# Patient Record
Sex: Female | Born: 1935
Health system: Southern US, Community
[De-identification: ages and names within clinical notes are randomized; demographics above are authoritative.]

## PROBLEM LIST (undated history)

## (undated) DIAGNOSIS — E1140 Type 2 diabetes mellitus with diabetic neuropathy, unspecified: Secondary | ICD-10-CM

## (undated) DIAGNOSIS — K5790 Diverticulosis of intestine, part unspecified, without perforation or abscess without bleeding: Secondary | ICD-10-CM

## (undated) DIAGNOSIS — E785 Hyperlipidemia, unspecified: Secondary | ICD-10-CM

## (undated) DIAGNOSIS — K56609 Unspecified intestinal obstruction, unspecified as to partial versus complete obstruction: Secondary | ICD-10-CM

## (undated) DIAGNOSIS — I10 Essential (primary) hypertension: Secondary | ICD-10-CM

## (undated) HISTORY — DX: Unspecified intestinal obstruction, unspecified as to partial versus complete obstruction: K56.609

## (undated) HISTORY — DX: Type 2 diabetes mellitus with diabetic neuropathy, unspecified: E11.40

## (undated) HISTORY — DX: Essential (primary) hypertension: I10

## (undated) HISTORY — DX: Diverticulosis of intestine, part unspecified, without perforation or abscess without bleeding: K57.90

## (undated) HISTORY — DX: Hyperlipidemia, unspecified: E78.5

## (undated) HISTORY — PX: ABDOMINAL HYSTERECTOMY: SHX81

---

## 1998-04-03 ENCOUNTER — Other Ambulatory Visit: Admission: RE | Admit: 1998-04-03 | Discharge: 1998-04-03 | Payer: Self-pay | Admitting: Obstetrics & Gynecology

## 1999-03-26 ENCOUNTER — Encounter: Admission: RE | Admit: 1999-03-26 | Discharge: 1999-03-26 | Payer: Self-pay | Admitting: Obstetrics & Gynecology

## 1999-03-26 ENCOUNTER — Encounter: Payer: Self-pay | Admitting: Obstetrics & Gynecology

## 2000-04-13 ENCOUNTER — Encounter: Admission: RE | Admit: 2000-04-13 | Discharge: 2000-04-13 | Payer: Self-pay | Admitting: Obstetrics & Gynecology

## 2000-04-13 ENCOUNTER — Encounter: Payer: Self-pay | Admitting: Obstetrics & Gynecology

## 2001-03-12 ENCOUNTER — Emergency Department (HOSPITAL_COMMUNITY): Admission: EM | Admit: 2001-03-12 | Discharge: 2001-03-12 | Payer: Self-pay | Admitting: *Deleted

## 2001-03-29 ENCOUNTER — Encounter: Payer: Self-pay | Admitting: Emergency Medicine

## 2001-03-29 ENCOUNTER — Emergency Department (HOSPITAL_COMMUNITY): Admission: EM | Admit: 2001-03-29 | Discharge: 2001-03-30 | Payer: Self-pay | Admitting: Emergency Medicine

## 2001-06-11 ENCOUNTER — Encounter: Payer: Self-pay | Admitting: Obstetrics & Gynecology

## 2001-06-11 ENCOUNTER — Encounter: Admission: RE | Admit: 2001-06-11 | Discharge: 2001-06-11 | Payer: Self-pay | Admitting: Obstetrics & Gynecology

## 2002-10-21 ENCOUNTER — Ambulatory Visit (HOSPITAL_COMMUNITY): Admission: RE | Admit: 2002-10-21 | Discharge: 2002-10-21 | Payer: Self-pay | Admitting: Gastroenterology

## 2003-12-25 ENCOUNTER — Other Ambulatory Visit: Admission: RE | Admit: 2003-12-25 | Discharge: 2003-12-25 | Payer: Self-pay | Admitting: Obstetrics & Gynecology

## 2005-12-03 ENCOUNTER — Ambulatory Visit: Payer: Self-pay | Admitting: Family Medicine

## 2006-09-07 ENCOUNTER — Ambulatory Visit: Payer: Self-pay | Admitting: Family Medicine

## 2006-12-17 ENCOUNTER — Ambulatory Visit: Payer: Self-pay | Admitting: Family Medicine

## 2006-12-30 ENCOUNTER — Ambulatory Visit: Payer: Self-pay | Admitting: Family Medicine

## 2007-02-04 HISTORY — PX: COLONOSCOPY: SHX174

## 2007-04-30 ENCOUNTER — Ambulatory Visit: Payer: Self-pay | Admitting: Family Medicine

## 2007-11-09 ENCOUNTER — Ambulatory Visit: Payer: Self-pay | Admitting: Family Medicine

## 2007-11-12 LAB — HM COLONOSCOPY

## 2007-11-23 ENCOUNTER — Ambulatory Visit: Payer: Self-pay | Admitting: Family Medicine

## 2008-05-31 ENCOUNTER — Ambulatory Visit: Payer: Self-pay | Admitting: Family Medicine

## 2008-09-29 ENCOUNTER — Ambulatory Visit: Payer: Self-pay | Admitting: Family Medicine

## 2008-11-24 ENCOUNTER — Ambulatory Visit: Payer: Self-pay | Admitting: Family Medicine

## 2008-11-25 ENCOUNTER — Inpatient Hospital Stay (HOSPITAL_COMMUNITY): Admission: EM | Admit: 2008-11-25 | Discharge: 2008-11-30 | Payer: Self-pay | Admitting: Emergency Medicine

## 2008-11-28 ENCOUNTER — Encounter (INDEPENDENT_AMBULATORY_CARE_PROVIDER_SITE_OTHER): Payer: Self-pay | Admitting: Internal Medicine

## 2008-12-14 ENCOUNTER — Ambulatory Visit: Payer: Self-pay | Admitting: Family Medicine

## 2009-05-18 ENCOUNTER — Ambulatory Visit: Payer: Self-pay | Admitting: Family Medicine

## 2009-10-12 ENCOUNTER — Ambulatory Visit: Payer: Self-pay | Admitting: Family Medicine

## 2009-11-14 ENCOUNTER — Ambulatory Visit: Payer: Self-pay | Admitting: Family Medicine

## 2010-02-15 ENCOUNTER — Ambulatory Visit
Admission: RE | Admit: 2010-02-15 | Discharge: 2010-02-15 | Payer: Self-pay | Source: Home / Self Care | Attending: Family Medicine | Admitting: Family Medicine

## 2010-05-09 LAB — BASIC METABOLIC PANEL
BUN: 12 mg/dL (ref 6–23)
BUN: 31 mg/dL — ABNORMAL HIGH (ref 6–23)
BUN: 6 mg/dL (ref 6–23)
CO2: 20 mEq/L (ref 19–32)
CO2: 21 mEq/L (ref 19–32)
CO2: 22 mEq/L (ref 19–32)
Calcium: 7.8 mg/dL — ABNORMAL LOW (ref 8.4–10.5)
Chloride: 106 mEq/L (ref 96–112)
Chloride: 108 mEq/L (ref 96–112)
Chloride: 108 mEq/L (ref 96–112)
Chloride: 112 mEq/L (ref 96–112)
Creatinine, Ser: 0.83 mg/dL (ref 0.4–1.2)
Creatinine, Ser: 0.89 mg/dL (ref 0.4–1.2)
GFR calc Af Amer: >60 mL/min (ref >60)
GFR calc Af Amer: >60 mL/min (ref >60)
GFR calc non Af Amer: 53 mL/min — ABNORMAL LOW (ref >60)
Glucose, Bld: 125 mg/dL — ABNORMAL HIGH (ref 70–99)
Glucose, Bld: 182 mg/dL — ABNORMAL HIGH (ref 70–99)
Glucose, Bld: 87 mg/dL (ref 70–99)
Glucose, Bld: 90 mg/dL (ref 70–99)
Potassium: 3.5 mEq/L (ref 3.5–5.1)
Potassium: 3.6 mEq/L (ref 3.5–5.1)
Sodium: 134 mEq/L — ABNORMAL LOW (ref 135–145)
Sodium: 141 mEq/L (ref 135–145)

## 2010-05-09 LAB — MAGNESIUM
Magnesium: 1.9 mg/dL (ref 1.5–2.5)
Magnesium: 2 mg/dL (ref 1.5–2.5)
Magnesium: 2.1 mg/dL (ref 1.5–2.5)
Magnesium: 2.1 mg/dL (ref 1.5–2.5)

## 2010-05-09 LAB — CBC
HCT: 32 % — ABNORMAL LOW (ref 36.0–46.0)
HCT: 34 % — ABNORMAL LOW (ref 36.0–46.0)
HCT: 46.6 % — ABNORMAL HIGH (ref 36.0–46.0)
Hemoglobin: 11.5 g/dL — ABNORMAL LOW (ref 12.0–15.0)
Hemoglobin: 11.8 g/dL — ABNORMAL LOW (ref 12.0–15.0)
Hemoglobin: 15.6 g/dL — ABNORMAL HIGH (ref 12.0–15.0)
MCHC: 33.5 g/dL (ref 30.0–36.0)
MCHC: 33.8 g/dL (ref 30.0–36.0)
MCHC: 34.1 g/dL (ref 30.0–36.0)
MCHC: 34.3 g/dL (ref 30.0–36.0)
MCV: 87.6 fL (ref 78.0–100.0)
MCV: 87.8 fL (ref 78.0–100.0)
MCV: 87.9 fL (ref 78.0–100.0)
MCV: 89 fL (ref 78.0–100.0)
Platelets: 120 10*3/uL — ABNORMAL LOW (ref 150–400)
Platelets: 127 10*3/uL — ABNORMAL LOW (ref 150–400)
Platelets: 135 10*3/uL — ABNORMAL LOW (ref 150–400)
RBC: 3.76 MIL/uL — ABNORMAL LOW (ref 3.87–5.11)
RBC: 3.94 MIL/uL (ref 3.87–5.11)
RBC: 5.26 MIL/uL — ABNORMAL HIGH (ref 3.87–5.11)
RDW: 13.9 % (ref 11.5–15.5)
RDW: 14 % (ref 11.5–15.5)
RDW: 14.1 % (ref 11.5–15.5)
RDW: 14.1 % (ref 11.5–15.5)
WBC: 5.2 10*3/uL (ref 4.0–10.5)
WBC: 5.7 10*3/uL (ref 4.0–10.5)
WBC: 5.9 10*3/uL (ref 4.0–10.5)

## 2010-05-09 LAB — COMPREHENSIVE METABOLIC PANEL
ALT: 22 U/L (ref 0–35)
AST: 28 U/L (ref 0–37)
Albumin: 2.5 g/dL — ABNORMAL LOW (ref 3.5–5.2)
Alkaline Phosphatase: 68 U/L (ref 39–117)
BUN: 50 mg/dL — ABNORMAL HIGH (ref 6–23)
BUN: 58 mg/dL — ABNORMAL HIGH (ref 6–23)
Calcium: 10.4 mg/dL (ref 8.4–10.5)
GFR calc non Af Amer: 36 mL/min — ABNORMAL LOW (ref >60)
Glucose, Bld: 133 mg/dL — ABNORMAL HIGH (ref 70–99)
Potassium: 3.2 mEq/L — ABNORMAL LOW (ref 3.5–5.1)
Sodium: 141 mEq/L (ref 135–145)
Total Bilirubin: 0.7 mg/dL (ref 0.3–1.2)

## 2010-05-09 LAB — GLUCOSE, CAPILLARY
Glucose-Capillary: 102 mg/dL — ABNORMAL HIGH (ref 70–99)
Glucose-Capillary: 105 mg/dL — ABNORMAL HIGH (ref 70–99)
Glucose-Capillary: 110 mg/dL — ABNORMAL HIGH (ref 70–99)
Glucose-Capillary: 128 mg/dL — ABNORMAL HIGH (ref 70–99)
Glucose-Capillary: 132 mg/dL — ABNORMAL HIGH (ref 70–99)
Glucose-Capillary: 137 mg/dL — ABNORMAL HIGH (ref 70–99)
Glucose-Capillary: 143 mg/dL — ABNORMAL HIGH (ref 70–99)
Glucose-Capillary: 168 mg/dL — ABNORMAL HIGH (ref 70–99)
Glucose-Capillary: 74 mg/dL (ref 70–99)
Glucose-Capillary: 81 mg/dL (ref 70–99)
Glucose-Capillary: 82 mg/dL (ref 70–99)
Glucose-Capillary: 82 mg/dL (ref 70–99)
Glucose-Capillary: 99 mg/dL (ref 70–99)

## 2010-05-09 LAB — URINALYSIS, ROUTINE W REFLEX MICROSCOPIC
Bilirubin Urine: NEGATIVE
Ketones, ur: 15 mg/dL — AB
Nitrite: NEGATIVE
Specific Gravity, Urine: 1.024 (ref 1.005–1.030)
Urobilinogen, UA: 0.2 mg/dL (ref 0.0–1.0)
pH: 5 (ref 5.0–8.0)

## 2010-05-09 LAB — PHOSPHORUS: Phosphorus: 1.1 mg/dL — ABNORMAL LOW (ref 2.3–4.6)

## 2010-05-09 LAB — LACTIC ACID, PLASMA
Lactic Acid, Venous: 0.7 mmol/L (ref 0.5–2.2)
Lactic Acid, Venous: 1 mmol/L (ref 0.5–2.2)

## 2010-05-09 LAB — DIFFERENTIAL
Basophils Relative: 0 % (ref 0–1)
Eosinophils Absolute: 0 10*3/uL (ref 0.0–0.7)
Eosinophils Absolute: 0.1 10*3/uL (ref 0.0–0.7)
Eosinophils Relative: 0 % (ref 0–5)
Eosinophils Relative: 2 % (ref 0–5)
Lymphocytes Relative: 12 % (ref 12–46)
Lymphocytes Relative: 12 % (ref 12–46)
Lymphs Abs: 0.7 10*3/uL (ref 0.7–4.0)
Monocytes Absolute: 0.4 10*3/uL (ref 0.1–1.0)

## 2010-05-09 LAB — POCT CARDIAC MARKERS: Troponin i, poc: <0.05 ng/mL (ref 0.00–0.09)

## 2010-05-09 LAB — HEMOGLOBIN A1C: Hgb A1c MFr Bld: 7.2 % — ABNORMAL HIGH (ref 4.6–6.1)

## 2010-05-09 LAB — URINE MICROSCOPIC-ADD ON

## 2010-05-09 LAB — LIPASE, BLOOD: Lipase: 23 U/L (ref 11–59)

## 2010-05-09 LAB — CALCIUM, IONIZED: Calcium, Ion: 1.15 mmol/L (ref 1.12–1.32)

## 2010-05-13 ENCOUNTER — Encounter: Payer: Self-pay | Admitting: Family Medicine

## 2010-06-14 ENCOUNTER — Encounter: Payer: Self-pay | Admitting: Family Medicine

## 2010-06-14 ENCOUNTER — Ambulatory Visit (INDEPENDENT_AMBULATORY_CARE_PROVIDER_SITE_OTHER): Payer: Medicare Other | Admitting: Family Medicine

## 2010-06-14 VITALS — BP 130/86 | HR 82 | Ht 64.8 in | Wt 171.0 lb

## 2010-06-14 DIAGNOSIS — J301 Allergic rhinitis due to pollen: Secondary | ICD-10-CM

## 2010-06-14 DIAGNOSIS — E1169 Type 2 diabetes mellitus with other specified complication: Secondary | ICD-10-CM

## 2010-06-14 DIAGNOSIS — E1159 Type 2 diabetes mellitus with other circulatory complications: Secondary | ICD-10-CM

## 2010-06-14 DIAGNOSIS — I152 Hypertension secondary to endocrine disorders: Secondary | ICD-10-CM

## 2010-06-14 DIAGNOSIS — E119 Type 2 diabetes mellitus without complications: Secondary | ICD-10-CM

## 2010-06-14 DIAGNOSIS — E785 Hyperlipidemia, unspecified: Secondary | ICD-10-CM

## 2010-06-14 DIAGNOSIS — Z0000 Encounter for general adult medical examination without abnormal findings: Secondary | ICD-10-CM

## 2010-06-14 DIAGNOSIS — I10 Essential (primary) hypertension: Secondary | ICD-10-CM

## 2010-06-14 DIAGNOSIS — J45909 Unspecified asthma, uncomplicated: Secondary | ICD-10-CM

## 2010-06-14 LAB — POCT URINALYSIS DIPSTICK
Ketones, UA: NEGATIVE
Protein, UA: NEGATIVE
Spec Grav, UA: 1.015

## 2010-06-14 LAB — CBC WITH DIFFERENTIAL/PLATELET
Basophils Absolute: 0 10*3/uL (ref 0.0–0.1)
Eosinophils Relative: 3 % (ref 0–5)
HCT: 37.8 % (ref 36.0–46.0)
Lymphocytes Relative: 28 % (ref 12–46)
Lymphs Abs: 1.6 10*3/uL (ref 0.7–4.0)
MCV: 86.5 fL (ref 78.0–100.0)
Monocytes Absolute: 0.4 10*3/uL (ref 0.1–1.0)
RDW: 14.4 % (ref 11.5–15.5)
WBC: 5.8 10*3/uL (ref 4.0–10.5)

## 2010-06-14 LAB — LIPID PANEL
Cholesterol: 119 mg/dL (ref 0–200)
HDL: 52 mg/dL (ref >39)
Total CHOL/HDL Ratio: 2.3 Ratio

## 2010-06-14 LAB — COMPREHENSIVE METABOLIC PANEL
BUN: 21 mg/dL (ref 6–23)
CO2: 23 mEq/L (ref 19–32)
Calcium: 9.2 mg/dL (ref 8.4–10.5)
Chloride: 105 mEq/L (ref 96–112)
Creat: 0.97 mg/dL (ref 0.40–1.20)
Glucose, Bld: 133 mg/dL — ABNORMAL HIGH (ref 70–99)

## 2010-06-14 NOTE — Progress Notes (Signed)
  Subjective:    Patient ID: Linda Garcia, female    DOB: [birth year], 76 y.o.   MRN: [id number]  HPI she is here for a complete examination. She continues on medications and they were reviewed. Advair still working for her although she is concerned that the medicine might not be functioning as well. She also is complaining of some earache.   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 Exercise is minimal especially during the allergy season Review of Systems  Constitutional: Negative.   HENT: Positive for ear pain, rhinorrhea, sneezing and postnasal drip.   Eyes: Negative.   Respiratory: Positive for wheezing.   Cardiovascular: Negative.   Gastrointestinal: Negative.   Genitourinary: Negative.   Musculoskeletal: Negative.   Skin: Negative.        Objective:   Physical Exam  Constitutional: She is oriented to person, place, and time. She appears well-developed and well-nourished.  HENT:  Head: Normocephalic and atraumatic.  Right Ear: External ear normal.  Left Ear: External ear normal.  Nose: Nose normal.  Mouth/Throat: Oropharynx is clear and moist.  Eyes: Conjunctivae and EOM are normal. Pupils are equal, round, and reactive to light. No scleral icterus.  Neck: Normal range of motion. Neck supple. No thyromegaly present.  Cardiovascular: Normal rate, regular rhythm, normal heart sounds and intact distal pulses.  Exam reveals no gallop and no friction rub.   No murmur heard. Abdominal: Soft. Bowel sounds are normal. She exhibits no distension and no mass. There is no tenderness.  Musculoskeletal: Normal range of motion.  Lymphadenopathy:    She has no cervical adenopathy.  Neurological: She is alert and oriented to person, place, and time. She displays abnormal reflex.          Assessment & Plan:  See chronic problem list. Patient will have a mammogram done and will also have her eye exam in the near future.

## 2010-06-14 NOTE — Patient Instructions (Signed)
Continue take good care of yourself. On her present medicines. I will call him the medicines for you. Check here in 4 months.

## 2010-06-17 ENCOUNTER — Telehealth: Payer: Self-pay

## 2010-06-17 NOTE — Telephone Encounter (Signed)
Left message that labs look really good continue on present meds and recheck in 4 months Linda Garcia

## 2010-06-21 NOTE — Op Note (Signed)
   NAME:  Linda Garcia, Linda Garcia                    ACCOUNT NO.:  [id number]   MEDICAL RECORD NO.:  [id number]                   PATIENT TYPE:  AMB   LOCATION:  ENDO                                 FACILITY:  [facility]   PHYSICIAN:  Graylin Shiver, M.D.                DATE OF BIRTH:  May 22, 1934   DATE OF PROCEDURE:  10/21/2002  DATE OF DISCHARGE:  10/21/2002                                 OPERATIVE REPORT   PROCEDURE:  Colonoscopy.   ENDOSCOPIST:  Graylin Shiver, M.D.   INDICATIONS:  Screening.   INFORMED CONSENT:  Informed consent was obtained after explanation of the  risks of bleeding, infection and perforation.   PREMEDICATION:  Fentanyl 62.5 mcg IV, Versed 7 mg IV.   DESCRIPTION OF PROCEDURE:  With the patient in the left lateral decubitus  position, a rectal exam was performed and no masses were felt.  The Olympus  colonoscope was inserted into the rectum and advanced around the colon to  the cecum.  The cecal landmarks were identified.  The cecum and ascending  colon were normal.  The transverse colon was normal.  The descending colon  and sigmoid revealed diverticulosis, which was most extensive in the sigmoid  region.  The rectum was normal.  She tolerated the procedure well without  complications.   IMPRESSION:  Left-sided diverticulosis.                                               Graylin Shiver, M.D.    Germain Osgood  D:  10/21/2002  T:  10/24/2002  Job:  [id number]   cc:   Sharlot Gowda, M.D.  [place]. [address] [place], [place] [address]  Fax: [phone]

## 2010-06-28 ENCOUNTER — Telehealth: Payer: Self-pay | Admitting: Family Medicine

## 2010-07-02 MED ORDER — EZETIMIBE-SIMVASTATIN 10-20 MG PO TABS: 1.0000 | ORAL_TABLET | Freq: Every day | ORAL | Status: DC

## 2010-07-02 NOTE — Telephone Encounter (Signed)
Vytorin call in

## 2010-10-09 ENCOUNTER — Telehealth: Payer: Self-pay | Admitting: Family Medicine

## 2010-10-09 MED ORDER — FLUTICASONE-SALMETEROL 100-50 MCG/DOSE IN AEPB: 1.0000 | INHALATION_SPRAY | Freq: Two times a day (BID) | RESPIRATORY_TRACT | Status: DC

## 2010-10-09 MED ORDER — METFORMIN HCL 500 MG PO TABS: 500.0000 mg | ORAL_TABLET | Freq: Two times a day (BID) | ORAL | Status: DC

## 2010-10-09 MED ORDER — LISINOPRIL-HYDROCHLOROTHIAZIDE 20-12.5 MG PO TABS: 1.0000 | ORAL_TABLET | Freq: Every day | ORAL | Status: DC

## 2010-10-09 NOTE — Telephone Encounter (Signed)
Meds were renewed 

## 2010-10-29 ENCOUNTER — Telehealth: Payer: Self-pay | Admitting: Family Medicine

## 2010-11-01 ENCOUNTER — Encounter: Payer: Self-pay | Admitting: Family Medicine

## 2010-11-01 ENCOUNTER — Ambulatory Visit (INDEPENDENT_AMBULATORY_CARE_PROVIDER_SITE_OTHER): Payer: Medicare Other | Admitting: Family Medicine

## 2010-11-01 DIAGNOSIS — E785 Hyperlipidemia, unspecified: Secondary | ICD-10-CM

## 2010-11-01 DIAGNOSIS — J45909 Unspecified asthma, uncomplicated: Secondary | ICD-10-CM | POA: Insufficient documentation

## 2010-11-01 DIAGNOSIS — E1159 Type 2 diabetes mellitus with other circulatory complications: Secondary | ICD-10-CM | POA: Insufficient documentation

## 2010-11-01 DIAGNOSIS — I10 Essential (primary) hypertension: Secondary | ICD-10-CM

## 2010-11-01 DIAGNOSIS — E118 Type 2 diabetes mellitus with unspecified complications: Secondary | ICD-10-CM | POA: Insufficient documentation

## 2010-11-01 DIAGNOSIS — E1169 Type 2 diabetes mellitus with other specified complication: Secondary | ICD-10-CM

## 2010-11-01 DIAGNOSIS — Z0000 Encounter for general adult medical examination without abnormal findings: Secondary | ICD-10-CM

## 2010-11-01 DIAGNOSIS — Z23 Encounter for immunization: Secondary | ICD-10-CM

## 2010-11-01 DIAGNOSIS — E119 Type 2 diabetes mellitus without complications: Secondary | ICD-10-CM

## 2010-11-01 NOTE — Progress Notes (Signed)
  Subjective:    Patient ID: Linda Garcia, female    DOB: 02-05-1934, 76 y.o.   MRN: [id number]  HPI She is here for a diabetes recheck. She continues on medications listed in the chart. She does intermittently use her albuterol for her asthma. She has an eye exam set up in the near future. She does not smoke or drink. Her exercise is minimal. Apparently her drug company cannot get lisinopril.   Review of Systems     Objective:   Physical Exam Alert and in no distress otherwise not examined. Hemoglobin A1c 7.2       Assessment & Plan:   1. Diabetes mellitus  POCT HgB A1C  2. Preventative health care  Flu vaccine greater than or equal to 3yo preservative free IM  3. Asthma    4. Hypertension associated with diabetes    5. Hyperlipidemia LDL goal <70    6. Need for prophylactic vaccination and inoculation against influenza     encouraged her to become more physically active. Continue on present medication regimen. Recheck here in 4 months.

## 2010-11-01 NOTE — Patient Instructions (Signed)
Increase her physical activity with walking. Try to walk around a half an hour 5 days a week

## 2010-11-05 NOTE — Telephone Encounter (Signed)
FYI ONLY

## 2011-02-10 ENCOUNTER — Other Ambulatory Visit: Payer: Self-pay | Admitting: Family Medicine

## 2011-02-26 ENCOUNTER — Ambulatory Visit (INDEPENDENT_AMBULATORY_CARE_PROVIDER_SITE_OTHER): Payer: Medicare Other | Admitting: Family Medicine

## 2011-02-26 ENCOUNTER — Encounter: Payer: Self-pay | Admitting: Family Medicine

## 2011-02-26 VITALS — BP 128/84 | HR 85 | Ht 64.5 in | Wt 176.0 lb

## 2011-02-26 DIAGNOSIS — E119 Type 2 diabetes mellitus without complications: Secondary | ICD-10-CM

## 2011-02-26 DIAGNOSIS — E785 Hyperlipidemia, unspecified: Secondary | ICD-10-CM | POA: Diagnosis not present

## 2011-02-26 DIAGNOSIS — E1169 Type 2 diabetes mellitus with other specified complication: Secondary | ICD-10-CM | POA: Diagnosis not present

## 2011-02-26 DIAGNOSIS — I10 Essential (primary) hypertension: Secondary | ICD-10-CM

## 2011-02-26 DIAGNOSIS — J45909 Unspecified asthma, uncomplicated: Secondary | ICD-10-CM

## 2011-02-26 DIAGNOSIS — E1159 Type 2 diabetes mellitus with other circulatory complications: Secondary | ICD-10-CM

## 2011-02-26 MED ORDER — FLUTICASONE-SALMETEROL 100-50 MCG/DOSE IN AEPB: 1.0000 | INHALATION_SPRAY | Freq: Two times a day (BID) | RESPIRATORY_TRACT | Status: DC

## 2011-02-26 MED ORDER — METFORMIN HCL 500 MG PO TABS: 500.0000 mg | ORAL_TABLET | Freq: Two times a day (BID) | ORAL | Status: DC

## 2011-02-26 NOTE — Progress Notes (Signed)
  Subjective:    Patient ID: Linda Garcia, female    DOB: 13-Apr-1934, 77 y.o.   MRN: [id number]  HPI She is here for recheck. She has had recent difficulty with URI which required more use of her albuterol. At this time she states she is feeling much better. She continues on her diabetes medications and does occasionally check her sugars. Her exercise is minimal. She does not smoke or drink. She had an eye exam in October.  Review of Systems     Objective:   Physical Exam Alert and in no distress. Blood A1c is 7.2. Exam of her feet shows normal pulses, decreased reflexes, normal vibratory. Skin shows no lesions.       Assessment & Plan:   1. Type II or unspecified type diabetes mellitus without mention of complication, not stated as uncontrolled  POCT HgB A1C  2. Asthma    3. Diabetes mellitus    4. Hyperlipidemia LDL goal <70    5. Hypertension associated with diabetes     Continue present medication regimen. Recheck here in 4 months.

## 2011-02-26 NOTE — Patient Instructions (Signed)
Keep taking good care of yourself 

## 2011-02-27 ENCOUNTER — Ambulatory Visit: Admitting: Family Medicine

## 2011-02-28 ENCOUNTER — Ambulatory Visit: Admitting: Family Medicine

## 2011-05-13 ENCOUNTER — Other Ambulatory Visit: Payer: Self-pay | Admitting: Family Medicine

## 2011-06-20 ENCOUNTER — Encounter: Payer: Self-pay | Admitting: Family Medicine

## 2011-06-20 ENCOUNTER — Ambulatory Visit (INDEPENDENT_AMBULATORY_CARE_PROVIDER_SITE_OTHER): Payer: Medicare Other | Admitting: Family Medicine

## 2011-06-20 ENCOUNTER — Telehealth: Payer: Self-pay | Admitting: Internal Medicine

## 2011-06-20 DIAGNOSIS — J45909 Unspecified asthma, uncomplicated: Secondary | ICD-10-CM

## 2011-06-20 DIAGNOSIS — E785 Hyperlipidemia, unspecified: Secondary | ICD-10-CM

## 2011-06-20 DIAGNOSIS — E1169 Type 2 diabetes mellitus with other specified complication: Secondary | ICD-10-CM

## 2011-06-20 DIAGNOSIS — Z79899 Other long term (current) drug therapy: Secondary | ICD-10-CM

## 2011-06-20 DIAGNOSIS — E119 Type 2 diabetes mellitus without complications: Secondary | ICD-10-CM

## 2011-06-20 DIAGNOSIS — I10 Essential (primary) hypertension: Secondary | ICD-10-CM

## 2011-06-20 DIAGNOSIS — J309 Allergic rhinitis, unspecified: Secondary | ICD-10-CM

## 2011-06-20 DIAGNOSIS — J302 Other seasonal allergic rhinitis: Secondary | ICD-10-CM | POA: Insufficient documentation

## 2011-06-20 LAB — LIPID PANEL
Cholesterol: 121 mg/dL (ref 0–200)
HDL: 57 mg/dL (ref >39)
Total CHOL/HDL Ratio: 2.1 Ratio
Triglycerides: 51 mg/dL (ref <150)

## 2011-06-20 LAB — CBC WITH DIFFERENTIAL/PLATELET
Basophils Absolute: 0 10*3/uL (ref 0.0–0.1)
Eosinophils Relative: 5 % (ref 0–5)
HCT: 36.9 % (ref 36.0–46.0)
Hemoglobin: 12.1 g/dL (ref 12.0–15.0)
Lymphocytes Relative: 34 % (ref 12–46)
MCHC: 32.8 g/dL (ref 30.0–36.0)
MCV: 86 fL (ref 78.0–100.0)
Monocytes Absolute: 0.4 10*3/uL (ref 0.1–1.0)
Monocytes Relative: 7 % (ref 3–12)
Neutro Abs: 2.8 10*3/uL (ref 1.7–7.7)
RDW: 14.8 % (ref 11.5–15.5)
WBC: 5.1 10*3/uL (ref 4.0–10.5)

## 2011-06-20 LAB — COMPREHENSIVE METABOLIC PANEL
AST: 25 U/L (ref 0–37)
Albumin: 4.4 g/dL (ref 3.5–5.2)
BUN: 20 mg/dL (ref 6–23)
Calcium: 9.6 mg/dL (ref 8.4–10.5)
Chloride: 105 mEq/L (ref 96–112)
Creat: 1.03 mg/dL (ref 0.50–1.10)
Glucose, Bld: 128 mg/dL — ABNORMAL HIGH (ref 70–99)
Potassium: 3.8 mEq/L (ref 3.5–5.3)

## 2011-06-20 MED ORDER — AMOXICILLIN 875 MG PO TABS: 875.0000 mg | ORAL_TABLET | Freq: Two times a day (BID) | ORAL | Status: AC

## 2011-06-20 MED ORDER — AMOXICILLIN 875 MG PO TABS: 875.0000 mg | ORAL_TABLET | Freq: Two times a day (BID) | ORAL | Status: DC

## 2011-06-20 NOTE — Telephone Encounter (Signed)
[facility] [address]

## 2011-06-20 NOTE — Progress Notes (Signed)
  Subjective:    Patient ID: Linda Garcia, female    DOB: Mar 15, 1934, 77 y.o.   MRN: [id number]  HPI She is here for a medication check. He has had intermittent difficulty with hoarse voice especially in the last month although she has had some bouts of bronchitis over the whole winter. No fever, chills, sore throat or earache. She is also had intermittent foot pain 2 or 3 times per week over the pain only last for a minute or 2. She does check her blood sugars regularly as well as her feet. Her last eye exam was October. Her allergies and asthma seem to be under good control although she is having some sneezing.  Review of Systems     Objective:   Physical Exam alert and in no distress. Tympanic membranes and canals are normal. Throat is clear. Tonsils are normal. Neck is supple without adenopathy or thyromegaly. Cardiac exam shows a regular sinus rhythm without murmurs or gallops. Lungs are clear to auscultation. Hemoglobin A1c is 7.0       Assessment & Plan:   1. Diabetes mellitus  POCT HgB A1C, POCT UA - Microalbumin  2. Asthma    3. Hypertension associated with diabetes  CBC with Differential, Comprehensive metabolic panel, Lipid panel  4. Hyperlipidemia LDL goal <70  Lipid panel  5. Allergic rhinitis, seasonal    6. Bronchitis with asthma, subacute  DISCONTINUED: amoxicillin (AMOXIL) 875 MG tablet  7. Encounter for long-term (current) use of other medications  CBC with Differential, Comprehensive metabolic panel, Lipid panel, POCT UA - Microalbumin   continue on present medication regimen. She is to call after she's completed a course of Amoxil let me know how she is doing. Recheck her in several months.

## 2011-06-20 NOTE — Patient Instructions (Signed)
Go ahead and use Claritin or Allegra for your allergy symptoms. Those don't work let he know and I will put her on a prescription strength

## 2011-08-22 DIAGNOSIS — Z1231 Encounter for screening mammogram for malignant neoplasm of breast: Secondary | ICD-10-CM | POA: Diagnosis not present

## 2011-08-25 ENCOUNTER — Encounter: Payer: Self-pay | Admitting: Internal Medicine

## 2011-09-02 DIAGNOSIS — N6489 Other specified disorders of breast: Secondary | ICD-10-CM | POA: Diagnosis not present

## 2011-09-12 ENCOUNTER — Telehealth: Payer: Self-pay | Admitting: Family Medicine

## 2011-09-12 MED ORDER — METFORMIN HCL 500 MG PO TABS: 500.0000 mg | ORAL_TABLET | Freq: Two times a day (BID) | ORAL | Status: DC

## 2011-09-12 NOTE — Telephone Encounter (Signed)
Rx refill was sent to the pharmacy. CLS 

## 2011-10-17 ENCOUNTER — Ambulatory Visit (INDEPENDENT_AMBULATORY_CARE_PROVIDER_SITE_OTHER): Payer: Medicare Other | Admitting: Family Medicine

## 2011-10-17 ENCOUNTER — Encounter: Payer: Self-pay | Admitting: Family Medicine

## 2011-10-17 VITALS — BP 114/70 | HR 66 | Wt 174.0 lb

## 2011-10-17 DIAGNOSIS — E1159 Type 2 diabetes mellitus with other circulatory complications: Secondary | ICD-10-CM

## 2011-10-17 DIAGNOSIS — E119 Type 2 diabetes mellitus without complications: Secondary | ICD-10-CM | POA: Diagnosis not present

## 2011-10-17 DIAGNOSIS — J45909 Unspecified asthma, uncomplicated: Secondary | ICD-10-CM | POA: Diagnosis not present

## 2011-10-17 DIAGNOSIS — J309 Allergic rhinitis, unspecified: Secondary | ICD-10-CM | POA: Diagnosis not present

## 2011-10-17 DIAGNOSIS — E1169 Type 2 diabetes mellitus with other specified complication: Secondary | ICD-10-CM

## 2011-10-17 DIAGNOSIS — I10 Essential (primary) hypertension: Secondary | ICD-10-CM

## 2011-10-17 DIAGNOSIS — E785 Hyperlipidemia, unspecified: Secondary | ICD-10-CM

## 2011-10-17 DIAGNOSIS — J302 Other seasonal allergic rhinitis: Secondary | ICD-10-CM

## 2011-10-17 MED ORDER — EZETIMIBE-SIMVASTATIN 10-20 MG PO TABS: 1.0000 | ORAL_TABLET | Freq: Every day | ORAL | Status: DC

## 2011-10-17 MED ORDER — ALBUTEROL SULFATE HFA 108 (90 BASE) MCG/ACT IN AERS: 2.0000 | INHALATION_SPRAY | Freq: Four times a day (QID) | RESPIRATORY_TRACT | Status: DC | PRN

## 2011-10-17 MED ORDER — LISINOPRIL-HYDROCHLOROTHIAZIDE 20-12.5 MG PO TABS: 1.0000 | ORAL_TABLET | Freq: Every day | ORAL | Status: DC

## 2011-10-17 MED ORDER — METFORMIN HCL 500 MG PO TABS: 500.0000 mg | ORAL_TABLET | Freq: Two times a day (BID) | ORAL | Status: DC

## 2011-10-17 NOTE — Progress Notes (Signed)
  Subjective:    Patient ID: Linda Garcia, female    DOB: 05-19-1934, 77 y.o.   MRN: [id number]  HPI She is here for a recheck. She does need several refills. Her weight and blood pressure are recorded. She does check her blood sugars and they run around 130. Her exercise is quite minimal. Social history was reviewed. She does not smoke or drink. She has an eye exam scheduled for October. She checks her feet regularly. She continues on her asthma medications and rarely uses the albuterol. Her last blood work was in May and this was reviewed.  Review of Systems     Objective:   Physical Exam Alert and in no distress. Hemoglobin A1c is 7.5.       Assessment & Plan:   1. Diabetes mellitus  POCT glycosylated hemoglobin (Hb A1C)  2. Asthma    3. Allergic rhinitis, seasonal    4. Hyperlipidemia LDL goal <70    5. Hypertension associated with diabetes     will continue on her present medication regimen. Encouraged her to increase her walking to a total of a half an hour 5 days per week. Discussed the fact that she can do this in 310 minute walks per day if she wanted. She is to call next week to see if we have geriatric flu shot in.

## 2011-10-17 NOTE — Patient Instructions (Signed)
Check your blood sugars either before you eat or 2 hours after a meal

## 2011-10-24 ENCOUNTER — Telehealth: Payer: Self-pay | Admitting: Family Medicine

## 2011-10-24 DIAGNOSIS — J45909 Unspecified asthma, uncomplicated: Secondary | ICD-10-CM

## 2011-10-24 MED ORDER — ALBUTEROL SULFATE HFA 108 (90 BASE) MCG/ACT IN AERS: 2.0000 | INHALATION_SPRAY | Freq: Four times a day (QID) | RESPIRATORY_TRACT | Status: DC | PRN

## 2011-10-24 NOTE — Telephone Encounter (Signed)
Is this okay to fill? 

## 2011-10-27 ENCOUNTER — Other Ambulatory Visit: Payer: Medicare Other

## 2011-10-27 DIAGNOSIS — Z23 Encounter for immunization: Secondary | ICD-10-CM | POA: Diagnosis not present

## 2011-10-27 MED ORDER — INFLUENZA VIRUS VACC SPLIT PF IM SUSP: 0.5000 mL | Freq: Once | INTRAMUSCULAR | Status: AC

## 2011-11-18 ENCOUNTER — Other Ambulatory Visit: Payer: Self-pay | Admitting: Family Medicine

## 2011-12-03 DIAGNOSIS — E119 Type 2 diabetes mellitus without complications: Secondary | ICD-10-CM | POA: Diagnosis not present

## 2011-12-03 LAB — HM DIABETES EYE EXAM

## 2012-01-17 ENCOUNTER — Other Ambulatory Visit: Payer: Self-pay | Admitting: Family Medicine

## 2012-02-13 ENCOUNTER — Telehealth: Payer: Self-pay | Admitting: Internal Medicine

## 2012-02-13 NOTE — Telephone Encounter (Signed)
Pt uses [facility].. Pt does not use [facility]

## 2012-02-20 ENCOUNTER — Ambulatory Visit (INDEPENDENT_AMBULATORY_CARE_PROVIDER_SITE_OTHER): Payer: Medicare Other | Admitting: Family Medicine

## 2012-02-20 ENCOUNTER — Encounter: Payer: Self-pay | Admitting: Family Medicine

## 2012-02-20 VITALS — BP 120/70 | Wt 178.0 lb

## 2012-02-20 DIAGNOSIS — I10 Essential (primary) hypertension: Secondary | ICD-10-CM | POA: Diagnosis not present

## 2012-02-20 DIAGNOSIS — E1159 Type 2 diabetes mellitus with other circulatory complications: Secondary | ICD-10-CM

## 2012-02-20 DIAGNOSIS — J309 Allergic rhinitis, unspecified: Secondary | ICD-10-CM

## 2012-02-20 DIAGNOSIS — E1169 Type 2 diabetes mellitus with other specified complication: Secondary | ICD-10-CM | POA: Diagnosis not present

## 2012-02-20 DIAGNOSIS — J302 Other seasonal allergic rhinitis: Secondary | ICD-10-CM

## 2012-02-20 DIAGNOSIS — J45909 Unspecified asthma, uncomplicated: Secondary | ICD-10-CM

## 2012-02-20 DIAGNOSIS — E785 Hyperlipidemia, unspecified: Secondary | ICD-10-CM

## 2012-02-20 DIAGNOSIS — E119 Type 2 diabetes mellitus without complications: Secondary | ICD-10-CM

## 2012-02-20 NOTE — Progress Notes (Signed)
  Subjective:    Patient ID: Linda Garcia, female    DOB: 1934-05-05, 78 y.o.   MRN: [id number]  HPI He is here for a diabetes recheck. She continues on medications listed in the chart. She rarely uses her albuterol and usually when she has a cold. This cold weather does interfere with going outside and exercising. She does have allergies and rarely uses antihistamines. She does check her blood sugars daily. Her last eye exam was October. He does check her feet regularly. Social history was reviewed. These issues were reviewed. She has not had a shingles vaccine but has checked. Apparently her insurance will not cover for this. Her last blood work was in May.   Review of Systems     Objective:   Physical Exam Alert and in no distress otherwise not examined. Hemoglobin A1c is 7.6.      Assessment & Plan:   1. Diabetes mellitus  POCT glycosylated hemoglobin (Hb A1C)  2. Asthma    3. Hypertension associated with diabetes    4. Hyperlipidemia LDL goal <70    5. Allergic rhinitis, seasonal     she seems to be taking 3 good care of herself. Recheck here in 4 months.

## 2012-03-16 DIAGNOSIS — R928 Other abnormal and inconclusive findings on diagnostic imaging of breast: Secondary | ICD-10-CM | POA: Diagnosis not present

## 2012-03-16 LAB — HM MAMMOGRAPHY

## 2012-03-23 ENCOUNTER — Other Ambulatory Visit: Payer: Self-pay | Admitting: Family Medicine

## 2012-06-24 ENCOUNTER — Ambulatory Visit (INDEPENDENT_AMBULATORY_CARE_PROVIDER_SITE_OTHER): Payer: Medicare Other | Admitting: Family Medicine

## 2012-06-24 ENCOUNTER — Encounter: Payer: Self-pay | Admitting: Family Medicine

## 2012-06-24 ENCOUNTER — Other Ambulatory Visit: Payer: Self-pay

## 2012-06-24 VITALS — BP 120/74 | HR 60 | Wt 176.0 lb

## 2012-06-24 DIAGNOSIS — J309 Allergic rhinitis, unspecified: Secondary | ICD-10-CM

## 2012-06-24 DIAGNOSIS — E1159 Type 2 diabetes mellitus with other circulatory complications: Secondary | ICD-10-CM

## 2012-06-24 DIAGNOSIS — E785 Hyperlipidemia, unspecified: Secondary | ICD-10-CM

## 2012-06-24 DIAGNOSIS — I10 Essential (primary) hypertension: Secondary | ICD-10-CM

## 2012-06-24 DIAGNOSIS — E119 Type 2 diabetes mellitus without complications: Secondary | ICD-10-CM

## 2012-06-24 DIAGNOSIS — Z79899 Other long term (current) drug therapy: Secondary | ICD-10-CM

## 2012-06-24 DIAGNOSIS — J45909 Unspecified asthma, uncomplicated: Secondary | ICD-10-CM | POA: Diagnosis not present

## 2012-06-24 DIAGNOSIS — E1169 Type 2 diabetes mellitus with other specified complication: Secondary | ICD-10-CM | POA: Diagnosis not present

## 2012-06-24 DIAGNOSIS — J302 Other seasonal allergic rhinitis: Secondary | ICD-10-CM

## 2012-06-24 LAB — CBC WITH DIFFERENTIAL/PLATELET
Basophils Relative: 0 % (ref 0–1)
Eosinophils Absolute: 0.2 10*3/uL (ref 0.0–0.7)
HCT: 35 % — ABNORMAL LOW (ref 36.0–46.0)
Hemoglobin: 12 g/dL (ref 12.0–15.0)
MCH: 28.6 pg (ref 26.0–34.0)
MCHC: 34.3 g/dL (ref 30.0–36.0)
Monocytes Absolute: 0.3 10*3/uL (ref 0.1–1.0)
Monocytes Relative: 7 % (ref 3–12)
Neutrophils Relative %: 56 % (ref 43–77)

## 2012-06-24 LAB — HM DIABETES FOOT EXAM: HM Diabetic Foot Exam: NORMAL

## 2012-06-24 MED ORDER — LISINOPRIL-HYDROCHLOROTHIAZIDE 20-12.5 MG PO TABS: ORAL_TABLET | ORAL | Status: DC

## 2012-06-24 MED ORDER — EZETIMIBE-SIMVASTATIN 10-20 MG PO TABS: 1.0000 | ORAL_TABLET | Freq: Every day | ORAL | Status: DC

## 2012-06-24 MED ORDER — METFORMIN HCL 500 MG PO TABS: ORAL_TABLET | ORAL | Status: DC

## 2012-06-24 MED ORDER — FLUTICASONE-SALMETEROL 100-50 MCG/DOSE IN AEPB: INHALATION_SPRAY | RESPIRATORY_TRACT | Status: DC

## 2012-06-24 NOTE — Progress Notes (Signed)
  Subjective:    Linda Garcia is a 78 y.o. female who presents for follow-up of Type 2 diabetes mellitus.    Home blood sugar records: 90'S METER [place]ATE  Current symptoms/problems; NONE Daily foot checks, foot concerns: YES / NONE Last eye exam:  12/18/11 [facility]   Medication compliance: Current diet: EAT HEALTHY Current exercise: JUST DAILY DUTY Known diabetic complications: none Cardiovascular risk factors: advanced age (older than 91 for men, 34 for women), diabetes mellitus, dyslipidemia, hypertension and sedentary lifestyle  She does complain of some left-sided pain mainly in the morning however if she passes gas, symptoms do get better. The following portions of the patient's history were reviewed and updated as appropriate: allergies, current medications, past family history, past medical history, past social history, past surgical history and problem list. Her allergies and asthma are under good control. She has no concerns or complaints. ROS as in subjective above    Objective:    Ext: no edema Foot exam:  Neuro: foot monofilament exam normal   Lab Review Lab Results  Component Value Date   HGBA1C 7.6 02/20/2012   Lab Results  Component Value Date   CHOL 121 06/20/2011   HDL 57 06/20/2011   LDLCALC 54 06/20/2011   TRIG 51 06/20/2011   CHOLHDL 2.1 06/20/2011   No results found for this basename: [facility]     Chemistry      Component Value Date/Time   NA 141 06/20/2011 0918   K 3.8 06/20/2011 0918   CL 105 06/20/2011 0918   CO2 28 06/20/2011 0918   BUN 20 06/20/2011 0918   CREATININE 1.03 06/20/2011 0918   CREATININE 0.92 11/30/2008 0417      Component Value Date/Time   CALCIUM 9.6 06/20/2011 0918   ALKPHOS 65 06/20/2011 0918   AST 25 06/20/2011 0918   ALT 13 06/20/2011 0918   BILITOT 0.3 06/20/2011 0918        Chemistry      Component Value Date/Time   NA 141 06/20/2011 0918   K 3.8 06/20/2011 0918   CL 105 06/20/2011 0918   CO2 28  06/20/2011 0918   BUN 20 06/20/2011 0918   CREATININE 1.03 06/20/2011 0918   CREATININE 0.92 11/30/2008 0417      Component Value Date/Time   CALCIUM 9.6 06/20/2011 0918   ALKPHOS 65 06/20/2011 0918   AST 25 06/20/2011 0918   ALT 13 06/20/2011 0918   BILITOT 0.3 06/20/2011 0918       Last optometry/ophthalmology exam reviewed from:    Assessment:   Encounter Diagnoses  Name Primary?  . Diabetes mellitus Yes  . Asthma   . Hypertension associated with diabetes   . Hyperlipidemia LDL goal <70   . Allergic rhinitis, seasonal   . Encounter for long-term (current) use of other medications          Plan:    1.  Rx changes: none 2.  Education: Reviewed 'ABCs' of diabetes management (respective goals in parentheses):  A1C (<7), blood pressure (<130/80), and cholesterol (LDL <100). 3.  Compliance at present is estimated to be good. Efforts to improve compliance (if necessary) will be directed at none. 4. Follow up: 4 months  prescription written for Zostavax. She will also be given Pneumovax. She did have a colonoscopy in 2009.  I reassured her that I did not think the abdominal distress was anything dangerous.

## 2012-06-24 NOTE — Telephone Encounter (Signed)
SENT PT MEDS IN

## 2012-06-25 LAB — COMPREHENSIVE METABOLIC PANEL
Alkaline Phosphatase: 59 U/L (ref 39–117)
BUN: 18 mg/dL (ref 6–23)
Creat: 0.93 mg/dL (ref 0.50–1.10)
Glucose, Bld: 148 mg/dL — ABNORMAL HIGH (ref 70–99)
Total Bilirubin: 0.3 mg/dL (ref 0.3–1.2)

## 2012-06-25 LAB — LIPID PANEL
Cholesterol: 116 mg/dL (ref 0–200)
HDL: 52 mg/dL (ref >39)
Total CHOL/HDL Ratio: 2.2 Ratio
VLDL: 9 mg/dL (ref 0–40)

## 2012-06-25 NOTE — Progress Notes (Signed)
Quick Note:  CALLED PT HOME # AND INFORMED HER OF HER LABS PT VERBALIZED UNDERSTANDING ______

## 2012-09-22 ENCOUNTER — Encounter: Payer: Self-pay | Admitting: Internal Medicine

## 2012-09-22 DIAGNOSIS — Z09 Encounter for follow-up examination after completed treatment for conditions other than malignant neoplasm: Secondary | ICD-10-CM | POA: Diagnosis not present

## 2012-09-22 DIAGNOSIS — N63 Unspecified lump in unspecified breast: Secondary | ICD-10-CM | POA: Diagnosis not present

## 2012-10-27 ENCOUNTER — Encounter: Payer: Self-pay | Admitting: Family Medicine

## 2012-10-27 ENCOUNTER — Ambulatory Visit (INDEPENDENT_AMBULATORY_CARE_PROVIDER_SITE_OTHER): Payer: Medicare Other | Admitting: Family Medicine

## 2012-10-27 VITALS — BP 122/78 | HR 85 | Wt 180.0 lb

## 2012-10-27 DIAGNOSIS — J45909 Unspecified asthma, uncomplicated: Secondary | ICD-10-CM

## 2012-10-27 DIAGNOSIS — E785 Hyperlipidemia, unspecified: Secondary | ICD-10-CM | POA: Diagnosis not present

## 2012-10-27 DIAGNOSIS — E119 Type 2 diabetes mellitus without complications: Secondary | ICD-10-CM | POA: Diagnosis not present

## 2012-10-27 DIAGNOSIS — E1169 Type 2 diabetes mellitus with other specified complication: Secondary | ICD-10-CM

## 2012-10-27 DIAGNOSIS — Z23 Encounter for immunization: Secondary | ICD-10-CM

## 2012-10-27 DIAGNOSIS — J309 Allergic rhinitis, unspecified: Secondary | ICD-10-CM

## 2012-10-27 DIAGNOSIS — J302 Other seasonal allergic rhinitis: Secondary | ICD-10-CM

## 2012-10-27 DIAGNOSIS — E1159 Type 2 diabetes mellitus with other circulatory complications: Secondary | ICD-10-CM

## 2012-10-27 DIAGNOSIS — I10 Essential (primary) hypertension: Secondary | ICD-10-CM

## 2012-10-27 LAB — POCT UA - MICROALBUMIN
Albumin/Creatinine Ratio, Urine, POC: 11.8
Microalbumin Ur, POC: 12.5 mg/L

## 2012-10-27 NOTE — Progress Notes (Signed)
Subjective:    Linda Garcia is a 78 y.o. female who presents for follow-up of Type 2 diabetes mellitus.    Home blood sugar records: 130  Current symptoms/problems include hands tingle Daily foot checks: yes    Any foot concerns: no Last eye exam:  10/13   Medication compliance: Excellent Current diet: diabetic Current exercise: none Known diabetic complications: none Cardiovascular risk factors: advanced age (older than 19 for men, 9 for women), diabetes mellitus, dyslipidemia and hypertension She continues on her asthma and allergy medicines and is having no difficulty with them. She does use her albuterol she says 4 times per month but not sure when exactly.  The following portions of the patient's history were reviewed and updated as appropriate: allergies, current medications, past family history, past medical history, past social history and problem list.  ROS as in subjective above    Objective:    There were no vitals taken for this visit.  There were no vitals filed for this visit.  General appearence: alert, no distress, WD/WN Neck: supple, no lymphadenopathy, no thyromegaly, no masses Heart: RRR, normal S1, S2, no murmurs Lungs: CTA bilaterally, no wheezes, rhonchi, or rales Abdomen: +bs, soft, non tender, non distended, no masses, no hepatomegaly, no splenomegaly Pulses: 2+ symmetric, upper and lower extremities, normal cap refill Ext: no edema Foot exam:  Neuro: foot monofilament exam normal   Lab Review Lab Results  Component Value Date   HGBA1C 7.2 06/24/2012   Lab Results  Component Value Date   CHOL 116 06/24/2012   HDL 52 06/24/2012   LDLCALC 55 06/24/2012   TRIG 43 06/24/2012   CHOLHDL 2.2 06/24/2012   No results found for this basenameConcepcion Elk     Chemistry      Component Value Date/Time   NA 139 06/24/2012 0953   K 4.1 06/24/2012 0953   CL 105 06/24/2012 0953   CO2 25 06/24/2012 0953   BUN 18 06/24/2012 0953   CREATININE 0.93 06/24/2012 0953   CREATININE 0.92 11/30/2008 0417      Component Value Date/Time   CALCIUM 9.6 06/24/2012 0953   ALKPHOS 59 06/24/2012 0953   AST 25 06/24/2012 0953   ALT 17 06/24/2012 0953   BILITOT 0.3 06/24/2012 0953        Chemistry      Component Value Date/Time   NA 139 06/24/2012 0953   K 4.1 06/24/2012 0953   CL 105 06/24/2012 0953   CO2 25 06/24/2012 0953   BUN 18 06/24/2012 0953   CREATININE 0.93 06/24/2012 0953   CREATININE 0.92 11/30/2008 0417      Component Value Date/Time   CALCIUM 9.6 06/24/2012 0953   ALKPHOS 59 06/24/2012 0953   AST 25 06/24/2012 0953   ALT 17 06/24/2012 0953   BILITOT 0.3 06/24/2012 0953       Last optometry/ophthalmology exam reviewed from:    Assessment:  Diabetes mellitus - Plan: POCT glycosylated hemoglobin (Hb A1C), POCT UA - Microalbumin  Hypertension associated with diabetes  Hyperlipidemia LDL goal <70  Asthma  Allergic rhinitis, seasonal  Need for prophylactic vaccination and inoculation against influenza - Plan: Flu Vaccine QUAD 36+ mos IM  flu shot given with risks and benefits discussed. Discussed the use of her albuterol. Recommend she keep track of when indeed she needs to use this.      Plan:    1.  Rx changes: none 2.  Education: Reviewed 'ABCs' of diabetes management (respective goals in parentheses):  A1C (<7), blood pressure (<130/80), and cholesterol (LDL <100). 3.  Compliance at present is estimated to be good. Efforts to improve compliance (if necessary) will be directed at none. 4. Follow up: 4 months

## 2012-10-27 NOTE — Patient Instructions (Signed)
If you need your rescue inhaler more than twice a week during the day or twice a month at night then let me know

## 2012-11-02 ENCOUNTER — Telehealth: Payer: Self-pay | Admitting: Internal Medicine

## 2012-11-02 DIAGNOSIS — E785 Hyperlipidemia, unspecified: Secondary | ICD-10-CM

## 2012-11-02 MED ORDER — EZETIMIBE-SIMVASTATIN 10-20 MG PO TABS: 1.0000 | ORAL_TABLET | Freq: Every day | ORAL | Status: DC

## 2012-11-02 MED ORDER — METFORMIN HCL 500 MG PO TABS: ORAL_TABLET | ORAL | Status: DC

## 2012-11-02 MED ORDER — LISINOPRIL-HYDROCHLOROTHIAZIDE 20-12.5 MG PO TABS: ORAL_TABLET | ORAL | Status: DC

## 2012-11-02 NOTE — Telephone Encounter (Signed)
Pt needs a refill of lisinopril-hctz, metformin and [facility]orin [facility]

## 2012-11-02 NOTE — Telephone Encounter (Signed)
SENT MEDS IN  

## 2012-11-14 ENCOUNTER — Other Ambulatory Visit: Payer: Self-pay | Admitting: Family Medicine

## 2012-12-27 DIAGNOSIS — E119 Type 2 diabetes mellitus without complications: Secondary | ICD-10-CM | POA: Diagnosis not present

## 2012-12-27 DIAGNOSIS — H18419 Arcus senilis, unspecified eye: Secondary | ICD-10-CM | POA: Diagnosis not present

## 2012-12-27 DIAGNOSIS — I10 Essential (primary) hypertension: Secondary | ICD-10-CM | POA: Diagnosis not present

## 2012-12-27 DIAGNOSIS — H25099 Other age-related incipient cataract, unspecified eye: Secondary | ICD-10-CM | POA: Diagnosis not present

## 2013-01-03 ENCOUNTER — Encounter: Payer: Self-pay | Admitting: Internal Medicine

## 2013-02-10 ENCOUNTER — Telehealth: Payer: Self-pay | Admitting: Family Medicine

## 2013-02-10 MED ORDER — ALBUTEROL SULFATE HFA 108 (90 BASE) MCG/ACT IN AERS: 2.0000 | INHALATION_SPRAY | Freq: Four times a day (QID) | RESPIRATORY_TRACT | Status: DC | PRN

## 2013-02-10 NOTE — Telephone Encounter (Signed)
Proventil was renewed

## 2013-02-11 ENCOUNTER — Telehealth: Payer: Self-pay | Admitting: Family Medicine

## 2013-02-11 NOTE — Telephone Encounter (Signed)
done

## 2013-02-17 DIAGNOSIS — Z1211 Encounter for screening for malignant neoplasm of colon: Secondary | ICD-10-CM | POA: Diagnosis not present

## 2013-02-17 DIAGNOSIS — Z8371 Family history of colonic polyps: Secondary | ICD-10-CM | POA: Diagnosis not present

## 2013-02-17 DIAGNOSIS — D126 Benign neoplasm of colon, unspecified: Secondary | ICD-10-CM | POA: Diagnosis not present

## 2013-02-17 DIAGNOSIS — K5730 Diverticulosis of large intestine without perforation or abscess without bleeding: Secondary | ICD-10-CM | POA: Diagnosis not present

## 2013-02-17 LAB — HM COLONOSCOPY

## 2013-03-04 ENCOUNTER — Encounter: Payer: Self-pay | Admitting: Family Medicine

## 2013-03-04 ENCOUNTER — Ambulatory Visit (INDEPENDENT_AMBULATORY_CARE_PROVIDER_SITE_OTHER): Payer: Medicare Other | Admitting: Family Medicine

## 2013-03-04 VITALS — BP 116/74 | HR 73 | Wt 180.0 lb

## 2013-03-04 DIAGNOSIS — E1169 Type 2 diabetes mellitus with other specified complication: Secondary | ICD-10-CM

## 2013-03-04 DIAGNOSIS — J45909 Unspecified asthma, uncomplicated: Secondary | ICD-10-CM

## 2013-03-04 DIAGNOSIS — I10 Essential (primary) hypertension: Secondary | ICD-10-CM

## 2013-03-04 DIAGNOSIS — E785 Hyperlipidemia, unspecified: Secondary | ICD-10-CM | POA: Diagnosis not present

## 2013-03-04 DIAGNOSIS — E119 Type 2 diabetes mellitus without complications: Secondary | ICD-10-CM

## 2013-03-04 DIAGNOSIS — I152 Hypertension secondary to endocrine disorders: Secondary | ICD-10-CM

## 2013-03-04 DIAGNOSIS — E1159 Type 2 diabetes mellitus with other circulatory complications: Secondary | ICD-10-CM

## 2013-03-04 LAB — POCT GLYCOSYLATED HEMOGLOBIN (HGB A1C): Hemoglobin A1C: 8

## 2013-03-04 MED ORDER — METFORMIN HCL 850 MG PO TABS: 850.0000 mg | ORAL_TABLET | Freq: Two times a day (BID) | ORAL | Status: DC

## 2013-03-04 NOTE — Progress Notes (Signed)
   Subjective:    Patient ID: Linda Garcia, female    DOB: 07-28-1934, 78 y.o.   MRN: [id number]  HPI She is here for a recheck. Her blood sugars have been as low as 32 but that was only one time when she was prepping for procedure. They usually run up to 140. She does check her feet regularly. Exercise is quite minimal. She complains of a vague tingling sensation to the right third finger but states that this tends to come and go. Her exercise is quite minimal. Her eating habits are unchanged. Smoking and drinking are not an issue. 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 She continues on her asthma medications and cold air does keep her from walking outside. She states that it makes her breathing worse.   Review of Systems     Objective:   Physical Exam Alert and in no distress. Hemoglobin A1c is 8.1.       Assessment & Plan:  Type II or unspecified type diabetes mellitus without mention of complication, not stated as uncontrolled - Plan: HgB A1c, metFORMIN (GLUCOPHAGE) 850 MG tablet  Asthma  Hyperlipidemia LDL goal <70  Hypertension associated with diabetes  I again encouraged her to become more physically active and make dietary changes. I will increase her metformin and recheck in 4 months.

## 2013-03-04 NOTE — Patient Instructions (Signed)
Take 1-1/2 pills twice per day of your present metformin and then start 1 pill twice a day of the new prescription

## 2013-04-06 DIAGNOSIS — R928 Other abnormal and inconclusive findings on diagnostic imaging of breast: Secondary | ICD-10-CM | POA: Diagnosis not present

## 2013-04-06 LAB — HM MAMMOGRAPHY

## 2013-04-12 ENCOUNTER — Encounter: Payer: Self-pay | Admitting: Family Medicine

## 2013-04-13 ENCOUNTER — Encounter: Payer: Self-pay | Admitting: Internal Medicine

## 2013-07-15 ENCOUNTER — Ambulatory Visit (INDEPENDENT_AMBULATORY_CARE_PROVIDER_SITE_OTHER): Payer: Medicare Other | Admitting: Family Medicine

## 2013-07-15 ENCOUNTER — Encounter: Payer: Self-pay | Admitting: Family Medicine

## 2013-07-15 VITALS — BP 120/70 | HR 80 | Wt 174.0 lb

## 2013-07-15 DIAGNOSIS — J45909 Unspecified asthma, uncomplicated: Secondary | ICD-10-CM

## 2013-07-15 DIAGNOSIS — E669 Obesity, unspecified: Secondary | ICD-10-CM

## 2013-07-15 DIAGNOSIS — E785 Hyperlipidemia, unspecified: Secondary | ICD-10-CM

## 2013-07-15 DIAGNOSIS — I10 Essential (primary) hypertension: Secondary | ICD-10-CM

## 2013-07-15 DIAGNOSIS — E1169 Type 2 diabetes mellitus with other specified complication: Secondary | ICD-10-CM

## 2013-07-15 DIAGNOSIS — E119 Type 2 diabetes mellitus without complications: Secondary | ICD-10-CM | POA: Diagnosis not present

## 2013-07-15 DIAGNOSIS — Z23 Encounter for immunization: Secondary | ICD-10-CM

## 2013-07-15 DIAGNOSIS — E1159 Type 2 diabetes mellitus with other circulatory complications: Secondary | ICD-10-CM

## 2013-07-15 LAB — CBC WITH DIFFERENTIAL/PLATELET
BASOS PCT: 0 % (ref 0–1)
Basophils Absolute: 0 10*3/uL (ref 0.0–0.1)
Eosinophils Absolute: 0.2 10*3/uL (ref 0.0–0.7)
Eosinophils Relative: 3 % (ref 0–5)
HEMATOCRIT: 35.5 % — AB (ref 36.0–46.0)
HEMOGLOBIN: 11.9 g/dL — AB (ref 12.0–15.0)
Lymphocytes Relative: 22 % (ref 12–46)
Lymphs Abs: 1.3 10*3/uL (ref 0.7–4.0)
MCH: 28 pg (ref 26.0–34.0)
MCHC: 33.5 g/dL (ref 30.0–36.0)
MCV: 83.5 fL (ref 78.0–100.0)
Monocytes Absolute: 0.4 10*3/uL (ref 0.1–1.0)
Monocytes Relative: 7 % (ref 3–12)
NEUTROS ABS: 4.1 10*3/uL (ref 1.7–7.7)
Neutrophils Relative %: 68 % (ref 43–77)
Platelets: 189 10*3/uL (ref 150–400)
RBC: 4.25 MIL/uL (ref 3.87–5.11)
RDW: 14.4 % (ref 11.5–15.5)
WBC: 6 10*3/uL (ref 4.0–10.5)

## 2013-07-15 LAB — POCT UA - MICROALBUMIN
Albumin/Creatinine Ratio, Urine, POC: 20.1
Creatinine, POC: 24.9 mg/dL
Microalbumin Ur, POC: 5 mg/L

## 2013-07-15 LAB — POCT GLYCOSYLATED HEMOGLOBIN (HGB A1C): Hemoglobin A1C: 7.5

## 2013-07-15 MED ORDER — EZETIMIBE-SIMVASTATIN 10-20 MG PO TABS: 1.0000 | ORAL_TABLET | Freq: Every day | ORAL | Status: DC

## 2013-07-15 MED ORDER — LISINOPRIL-HYDROCHLOROTHIAZIDE 20-12.5 MG PO TABS: ORAL_TABLET | ORAL | Status: DC

## 2013-07-15 MED ORDER — FLUTICASONE-SALMETEROL 100-50 MCG/DOSE IN AEPB: INHALATION_SPRAY | RESPIRATORY_TRACT | Status: DC

## 2013-07-15 MED ORDER — METFORMIN HCL 850 MG PO TABS: 850.0000 mg | ORAL_TABLET | Freq: Two times a day (BID) | ORAL | Status: DC

## 2013-07-15 NOTE — Progress Notes (Signed)
   Subjective:    Patient ID: Linda Garcia, female    DOB: 1934/10/10, 79 y.o.   MRN: [id number]  HPI She is here for a diabetes recheck. She checks her blood sugars regularly and they are in the low 100s. . Continues on her present medications including her asthma medication. The asthma does interfere with her ability to exercise regularly. Her eating habits are unchanged. Smoking and drinking were reviewed. Her immunizations were reviewed. Last blood work was a little over a year ago. She has no concerns or complaints.   Review of Systems     Objective:   Physical Exam Alert and in no distress. Foot exam done :normal except bunions.. Hemoglobin A1c is 7.5.       Assessment & Plan:  Diabetes mellitus - Plan: HgB A1c, HM DIABETES FOOT EXAM, CBC with Differential, Comprehensive metabolic panel, Lipid panel, POCT UA - Microalbumin, HM DIABETES FOOT EXAM  Need for prophylactic vaccination against Streptococcus pneumoniae (pneumococcus) - Plan: Pneumococcal polysaccharide vaccine 23-valent greater than or equal to 2yo subcutaneous/IM  Asthma - Plan: Fluticasone-Salmeterol (ADVAIR DISKUS) 100-50 MCG/DOSE AEPB  Hypertension associated with diabetes - Plan: CBC with Differential, Comprehensive metabolic panel, lisinopril-hydrochlorothiazide (PRINZIDE,ZESTORETIC) 20-12.5 MG per tablet  Hyperlipidemia LDL goal <70 - Plan: Lipid panel, ezetimibe-simvastatin (VYTORIN) 10-20 MG per tablet  Obesity (BMI 30-39.9)  Type II or unspecified type diabetes mellitus without mention of complication, not stated as uncontrolled - Plan: metFORMIN (GLUCOPHAGE) 850 MG tablet

## 2013-07-16 LAB — COMPREHENSIVE METABOLIC PANEL
ALK PHOS: 64 U/L (ref 39–117)
ALT: 15 U/L (ref 0–35)
AST: 21 U/L (ref 0–37)
Albumin: 4.2 g/dL (ref 3.5–5.2)
BUN: 21 mg/dL (ref 6–23)
CALCIUM: 9.3 mg/dL (ref 8.4–10.5)
CHLORIDE: 102 meq/L (ref 96–112)
CO2: 27 mEq/L (ref 19–32)
Creat: 0.87 mg/dL (ref 0.50–1.10)
GLUCOSE: 141 mg/dL — AB (ref 70–99)
POTASSIUM: 3.9 meq/L (ref 3.5–5.3)
Sodium: 138 mEq/L (ref 135–145)
Total Bilirubin: 0.3 mg/dL (ref 0.2–1.2)
Total Protein: 6.3 g/dL (ref 6.0–8.3)

## 2013-07-16 LAB — LIPID PANEL
Cholesterol: 102 mg/dL (ref 0–200)
HDL: 52 mg/dL (ref >39)
LDL Cholesterol: 40 mg/dL (ref 0–99)
TRIGLYCERIDES: 50 mg/dL (ref <150)
Total CHOL/HDL Ratio: 2 Ratio
VLDL: 10 mg/dL (ref 0–40)

## 2013-11-16 ENCOUNTER — Other Ambulatory Visit (INDEPENDENT_AMBULATORY_CARE_PROVIDER_SITE_OTHER): Payer: Medicare Other

## 2013-11-16 DIAGNOSIS — Z23 Encounter for immunization: Secondary | ICD-10-CM

## 2013-12-02 ENCOUNTER — Ambulatory Visit (INDEPENDENT_AMBULATORY_CARE_PROVIDER_SITE_OTHER): Payer: Medicare Other | Admitting: Family Medicine

## 2013-12-02 ENCOUNTER — Encounter: Payer: Self-pay | Admitting: Family Medicine

## 2013-12-02 VITALS — BP 114/70 | HR 90 | Wt 170.0 lb

## 2013-12-02 DIAGNOSIS — I10 Essential (primary) hypertension: Secondary | ICD-10-CM

## 2013-12-02 DIAGNOSIS — E785 Hyperlipidemia, unspecified: Secondary | ICD-10-CM | POA: Diagnosis not present

## 2013-12-02 DIAGNOSIS — E119 Type 2 diabetes mellitus without complications: Secondary | ICD-10-CM

## 2013-12-02 DIAGNOSIS — E1169 Type 2 diabetes mellitus with other specified complication: Secondary | ICD-10-CM | POA: Diagnosis not present

## 2013-12-02 DIAGNOSIS — J4530 Mild persistent asthma, uncomplicated: Secondary | ICD-10-CM | POA: Diagnosis not present

## 2013-12-02 DIAGNOSIS — E669 Obesity, unspecified: Secondary | ICD-10-CM

## 2013-12-02 DIAGNOSIS — E1159 Type 2 diabetes mellitus with other circulatory complications: Secondary | ICD-10-CM

## 2013-12-02 LAB — POCT GLYCOSYLATED HEMOGLOBIN (HGB A1C): Hemoglobin A1C: 7.4

## 2013-12-02 MED ORDER — METFORMIN HCL 850 MG PO TABS: 850.0000 mg | ORAL_TABLET | Freq: Two times a day (BID) | ORAL | Status: DC

## 2013-12-02 MED ORDER — ALBUTEROL SULFATE HFA 108 (90 BASE) MCG/ACT IN AERS: 2.0000 | INHALATION_SPRAY | Freq: Four times a day (QID) | RESPIRATORY_TRACT | Status: DC | PRN

## 2013-12-02 NOTE — Progress Notes (Signed)
  Subjective:    Linda Garcia is a 79 y.o. female who presents for follow-up of Type 2 diabetes mellitus.    Home blood sugar records: Patient test one time a day runs 120 to 130  Current symptoms/problems None Daily foot checks: Any foot concerns: none Last eye exam: 12/27/2012    Medication compliance: Good Current diet: none Current exercise: none Known diabetic complications: none Cardiovascular risk factors: advanced age (older than 50 for men, 64 for women), diabetes mellitus, dyslipidemia, hypertension and sedentary lifestyle   The following portions of the patient's history were reviewed and updated as appropriate: allergies, current medications, past family history, past medical history, past social history and problem list. 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 ROS as in subjective above    Objective:    Wt 170 lb (77.111 kg)   General appearence: alert, no distress, WD/WN  Lab Review Lab Results  Component Value Date   HGBA1C 7.5 07/15/2013   Lab Results  Component Value Date   CHOL 102 07/15/2013   HDL 52 07/15/2013   LDLCALC 40 07/15/2013   TRIG 50 07/15/2013   CHOLHDL 2.0 07/15/2013   No results found for this basename: Derl Barrow     Chemistry      Component Value Date/Time   NA 138 07/15/2013 1054   K 3.9 07/15/2013 1054   CL 102 07/15/2013 1054   CO2 27 07/15/2013 1054   BUN 21 07/15/2013 1054   CREATININE 0.87 07/15/2013 1054   CREATININE 0.92 11/30/2008 0417      Component Value Date/Time   CALCIUM 9.3 07/15/2013 1054   ALKPHOS 64 07/15/2013 1054   AST 21 07/15/2013 1054   ALT 15 07/15/2013 1054   BILITOT 0.3 07/15/2013 1054        Chemistry      Component Value Date/Time   NA 138 07/15/2013 1054   K 3.9 07/15/2013 1054   CL 102 07/15/2013 1054   CO2 27 07/15/2013 1054   BUN 21 07/15/2013 1054   CREATININE 0.87 07/15/2013 1054   CREATININE 0.92 11/30/2008 0417      Component Value Date/Time   CALCIUM 9.3 07/15/2013  1054   ALKPHOS 64 07/15/2013 1054   AST 21 07/15/2013 1054   ALT 15 07/15/2013 1054   BILITOT 0.3 07/15/2013 1054          Assessment:  Diabetes mellitus without complication - Plan: POCT glycosylated hemoglobin (Hb A1C)  Type 2 diabetes mellitus without complication - Plan: metFORMIN (GLUCOPHAGE) 850 MG tablet  Asthma, mild persistent, uncomplicated - Plan: albuterol (PROVENTIL HFA;VENTOLIN HFA) 108 (90 BASE) MCG/ACT inhaler  Hyperlipidemia LDL goal <70  Hypertension associated with diabetes  Obesity (BMI 30-39.9)  Hemoglobin A1c 7.4      Plan:    1.  Rx changes: none 2.  Education: Reviewed 'ABCs' of diabetes management (respective goals in parentheses):  A1C (<7), blood pressure (<130/80), and cholesterol (LDL <100). 3.  Compliance at present is estimated to be good. Efforts to improve compliance (if necessary) will be directed at increased exercise. 4. Follow up: 4 months

## 2014-02-08 DIAGNOSIS — E119 Type 2 diabetes mellitus without complications: Secondary | ICD-10-CM | POA: Diagnosis not present

## 2014-02-08 LAB — HM DIABETES EYE EXAM

## 2014-02-14 ENCOUNTER — Encounter: Payer: Self-pay | Admitting: Family Medicine

## 2014-02-15 ENCOUNTER — Encounter: Payer: Self-pay | Admitting: Internal Medicine

## 2014-04-07 ENCOUNTER — Encounter: Payer: Self-pay | Admitting: Family Medicine

## 2014-04-07 ENCOUNTER — Ambulatory Visit (INDEPENDENT_AMBULATORY_CARE_PROVIDER_SITE_OTHER): Payer: Medicare Other | Admitting: Family Medicine

## 2014-04-07 VITALS — BP 118/80 | HR 86 | Wt 170.0 lb

## 2014-04-07 DIAGNOSIS — E669 Obesity, unspecified: Secondary | ICD-10-CM

## 2014-04-07 DIAGNOSIS — E785 Hyperlipidemia, unspecified: Secondary | ICD-10-CM

## 2014-04-07 DIAGNOSIS — J4530 Mild persistent asthma, uncomplicated: Secondary | ICD-10-CM

## 2014-04-07 DIAGNOSIS — I10 Essential (primary) hypertension: Secondary | ICD-10-CM

## 2014-04-07 DIAGNOSIS — E1159 Type 2 diabetes mellitus with other circulatory complications: Secondary | ICD-10-CM

## 2014-04-07 DIAGNOSIS — E1169 Type 2 diabetes mellitus with other specified complication: Secondary | ICD-10-CM

## 2014-04-07 DIAGNOSIS — J302 Other seasonal allergic rhinitis: Secondary | ICD-10-CM | POA: Diagnosis not present

## 2014-04-07 DIAGNOSIS — E119 Type 2 diabetes mellitus without complications: Secondary | ICD-10-CM | POA: Diagnosis not present

## 2014-04-07 LAB — POCT GLYCOSYLATED HEMOGLOBIN (HGB A1C): Hemoglobin A1C: 7.1

## 2014-04-07 NOTE — Progress Notes (Signed)
  Subjective:    Patient ID: Linda Garcia, female    DOB: [birth year], 80 y.o.   MRN: [id number]  Linda Garcia is a 80 y.o. female who presents for follow-up of Type 2 diabetes mellitus. She does have URI symptoms at the present time. She continues on her allergy meds and asthma and is doing well on them. Allergies actually caused trouble all year-round.  Home blood sugar records: Patient test one time a day Current symptoms/problems fingers ting. sometimes Daily foot checks:   Any foot concerns:no Exercise: regular walking around EYE 02/08/14/ The following portions of the patient's history were reviewed and updated as appropriate: allergies, current medications, past medical history, past social history and problem list.  ROS as in subjective above.     Objective:    Physical Exam Alert and in no distress otherwise not examined.  Lab Review Diabetic Labs Latest Ref Rng 04/07/2014 12/02/2013 07/15/2013 03/04/2013 10/27/2012  HbA1c - 7.1 7.4 7.5 8.0 7.4  Chol 0 - 200 mg/dL - - 102 - -  HDL >39 mg/dL - - 52 - -  Calc LDL 0 - 99 mg/dL - - 40 - -  Triglycerides <150 mg/dL - - 50 - -  Creatinine 0.50 - 1.10 mg/dL - - 0.87 - -   BP/Weight 04/07/2014 12/02/2013 07/15/2013 03/04/2013 4/82/5003  Systolic BP 704 888 916 945 038  Diastolic BP 80 70 70 74 78  Wt. (Lbs) 170 170 174 180 180  BMI 28.74 28.74 29.42 30.43 30.43   Foot/eye exam completion dates Latest Ref Rng 02/08/2014 07/15/2013  Eye Exam No Retinopathy No Retinopathy -  Foot exam Order - - -  Foot Form Completion - - Done    Linda Garcia  reports that she has never smoked. She has never used smokeless tobacco. She reports that she does not drink alcohol or use illicit drugs. Hemoglobin A1c 7.1    Assessment & Plan:   Encounter Diagnoses  Name Primary?  . Diabetes mellitus without complication Yes  . Hypertension associated with diabetes   . Obesity (BMI 30-39.9)   . Hyperlipidemia LDL goal <70   . Asthma, mild  persistent, uncomplicated   . Allergic rhinitis, seasonal     1. Rx changes: none 2. Education: Reviewed 'ABCs' of diabetes management (respective goals in parentheses):  A1C (<7), blood pressure (<130/80), and cholesterol (LDL <100). 3. Compliance at present is estimated to be excellent. Efforts to improve compliance (if necessary) will be directed at Nothing. 4. Follow up: 4 months

## 2014-04-07 NOTE — Patient Instructions (Signed)
Try NyQuil at night to help you with coughing and sleep

## 2014-04-19 DIAGNOSIS — Z1231 Encounter for screening mammogram for malignant neoplasm of breast: Secondary | ICD-10-CM | POA: Diagnosis not present

## 2014-04-19 LAB — HM MAMMOGRAPHY

## 2014-04-20 ENCOUNTER — Encounter: Payer: Self-pay | Admitting: Internal Medicine

## 2014-04-23 ENCOUNTER — Other Ambulatory Visit: Payer: Self-pay | Admitting: Family Medicine

## 2014-06-04 ENCOUNTER — Other Ambulatory Visit: Payer: Self-pay | Admitting: Family Medicine

## 2014-07-13 ENCOUNTER — Ambulatory Visit (INDEPENDENT_AMBULATORY_CARE_PROVIDER_SITE_OTHER): Payer: Medicare Other | Admitting: Family Medicine

## 2014-07-13 ENCOUNTER — Encounter: Payer: Self-pay | Admitting: Family Medicine

## 2014-07-13 VITALS — BP 120/70 | HR 77 | Ht 65.0 in | Wt 171.0 lb

## 2014-07-13 DIAGNOSIS — E785 Hyperlipidemia, unspecified: Secondary | ICD-10-CM

## 2014-07-13 DIAGNOSIS — J4530 Mild persistent asthma, uncomplicated: Secondary | ICD-10-CM

## 2014-07-13 DIAGNOSIS — E1169 Type 2 diabetes mellitus with other specified complication: Secondary | ICD-10-CM | POA: Diagnosis not present

## 2014-07-13 DIAGNOSIS — J302 Other seasonal allergic rhinitis: Secondary | ICD-10-CM | POA: Diagnosis not present

## 2014-07-13 DIAGNOSIS — I10 Essential (primary) hypertension: Secondary | ICD-10-CM

## 2014-07-13 DIAGNOSIS — E119 Type 2 diabetes mellitus without complications: Secondary | ICD-10-CM

## 2014-07-13 DIAGNOSIS — J4540 Moderate persistent asthma, uncomplicated: Secondary | ICD-10-CM

## 2014-07-13 DIAGNOSIS — E1159 Type 2 diabetes mellitus with other circulatory complications: Secondary | ICD-10-CM

## 2014-07-13 DIAGNOSIS — E669 Obesity, unspecified: Secondary | ICD-10-CM | POA: Diagnosis not present

## 2014-07-13 MED ORDER — ALBUTEROL SULFATE HFA 108 (90 BASE) MCG/ACT IN AERS: 2.0000 | INHALATION_SPRAY | Freq: Four times a day (QID) | RESPIRATORY_TRACT | Status: DC | PRN

## 2014-07-13 NOTE — Progress Notes (Signed)
   Subjective:    Patient ID: Linda Garcia, female    DOB: May 19, 1934, 80 y.o.   MRN: [id number]  HPI She is here for medication check. He does have underlying diabetes and takes good care of herself in terms of diet,  foot care, eye care. She does not smoke or drink. Her physical to the level is quite limited.She does have underlying asthma and continues on medications. She does need a refill on her albuterol. She continues on her blood pressure medications as well as cholesterol. Her allergies are under good control using OTC medications. She does not have an advanced directive. She demonstrates no cognitive or physical disabilities. She has not fallen. 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 She's had no chest pain, bowel habit changes. Social history, health maintenance and immunizations were reviewed.   Review of Systems  All other systems reviewed and are negative.      Objective:   Physical Exam BP 120/70 mmHg  Pulse 77  Ht 5\' 5"  (1.651 m)  Wt 171 lb (77.565 kg)  BMI 28.46 kg/m2  SpO2 98%  General Appearance:    Alert, cooperative, no distress, appears stated age  Head:    Normocephalic, without obvious abnormality, atraumatic  Eyes:    PERRL, conjunctiva/corneas clear, EOM's intact, fundi    benign  Ears:    Normal TM's and external ear canals  Nose:   Nares normal, mucosa normal, no drainage or sinus   tenderness  Throat:   Lips, mucosa, and tongue normal; teeth and gums normal  Neck:   Supple, no lymphadenopathy;  thyroid:  no   enlargement/tenderness/nodules; no carotid   bruit or JVD  Back:    Spine nontender, no curvature, ROM normal, no CVA     tenderness  Lungs:     Clear to auscultation bilaterally without wheezes, rales or     ronchi; respirations unlabored  Chest Wall:    No tenderness or deformity   Heart:    Regular rate and rhythm, S1 and S2 normal, no murmur, rub   or gallop  Breast Exam:     Deferred to GYN  Abdomen:     Soft, non-tender, nondistended, normoactive bowel sounds,    no masses, no hepatosplenomegaly  Genitalia:    Deferred to GYN     Extremities:   No clubbing, cyanosis or edema  Pulses:   2+ and symmetric all extremities  Skin:   Skin color, texture, turgor normal, no rashes or lesions  Lymph nodes:   Cervical, supraclavicular, and axillary nodes normal  Neurologic:   CNII-XII intact, normal strength, sensation and gait; reflexes 2+ and symmetric throughout          Psych:   Normal mood, affect, hygiene and grooming.          Assessment & Plan:  Type 2 diabetes mellitus without complication  Asthma, moderate persistent, uncomplicated  Asthma, mild persistent, uncomplicated - Plan: albuterol (PROVENTIL HFA;VENTOLIN HFA) 108 (90 BASE) MCG/ACT inhaler  Hypertension associated with diabetes  Hyperlipidemia LDL goal <70  Allergic rhinitis, seasonal  Obesity (BMI 30-39.9) Overall she is doing a good job taking care of herself. Her albuterol was renewed. Did encourage her to keep herself both physically and mentally active.

## 2014-08-09 ENCOUNTER — Ambulatory Visit: Payer: Medicare Other | Admitting: Family Medicine

## 2014-09-25 ENCOUNTER — Ambulatory Visit: Payer: Self-pay | Admitting: Family Medicine

## 2014-10-02 ENCOUNTER — Ambulatory Visit (INDEPENDENT_AMBULATORY_CARE_PROVIDER_SITE_OTHER): Payer: Medicare Other | Admitting: Family Medicine

## 2014-10-02 ENCOUNTER — Encounter: Payer: Self-pay | Admitting: Family Medicine

## 2014-10-02 VITALS — BP 120/70 | HR 79 | Ht 65.0 in | Wt 175.0 lb

## 2014-10-02 DIAGNOSIS — Z23 Encounter for immunization: Secondary | ICD-10-CM | POA: Diagnosis not present

## 2014-10-02 DIAGNOSIS — E785 Hyperlipidemia, unspecified: Secondary | ICD-10-CM | POA: Diagnosis not present

## 2014-10-02 DIAGNOSIS — E119 Type 2 diabetes mellitus without complications: Secondary | ICD-10-CM | POA: Diagnosis not present

## 2014-10-02 DIAGNOSIS — E1159 Type 2 diabetes mellitus with other circulatory complications: Secondary | ICD-10-CM

## 2014-10-02 DIAGNOSIS — J302 Other seasonal allergic rhinitis: Secondary | ICD-10-CM | POA: Diagnosis not present

## 2014-10-02 DIAGNOSIS — E1169 Type 2 diabetes mellitus with other specified complication: Secondary | ICD-10-CM | POA: Diagnosis not present

## 2014-10-02 DIAGNOSIS — E669 Obesity, unspecified: Secondary | ICD-10-CM

## 2014-10-02 DIAGNOSIS — I10 Essential (primary) hypertension: Secondary | ICD-10-CM | POA: Diagnosis not present

## 2014-10-02 DIAGNOSIS — J4530 Mild persistent asthma, uncomplicated: Secondary | ICD-10-CM

## 2014-10-02 LAB — LIPID PANEL
Cholesterol: 106 mg/dL — ABNORMAL LOW (ref 125–200)
HDL: 55 mg/dL (ref >=46)
LDL Cholesterol: 42 mg/dL (ref <130)
Total CHOL/HDL Ratio: 1.9 Ratio (ref <=5.0)
Triglycerides: 45 mg/dL (ref <150)
VLDL: 9 mg/dL (ref <30)

## 2014-10-02 LAB — CBC WITH DIFFERENTIAL/PLATELET
BASOS ABS: 0 10*3/uL (ref 0.0–0.1)
Basophils Relative: 0 % (ref 0–1)
EOS PCT: 6 % — AB (ref 0–5)
Eosinophils Absolute: 0.3 10*3/uL (ref 0.0–0.7)
HCT: 36.3 % (ref 36.0–46.0)
Hemoglobin: 11.8 g/dL — ABNORMAL LOW (ref 12.0–15.0)
LYMPHS PCT: 29 % (ref 12–46)
Lymphs Abs: 1.5 10*3/uL (ref 0.7–4.0)
MCH: 28 pg (ref 26.0–34.0)
MCHC: 32.5 g/dL (ref 30.0–36.0)
MCV: 86.2 fL (ref 78.0–100.0)
MPV: 10 fL (ref 8.6–12.4)
Monocytes Absolute: 0.4 10*3/uL (ref 0.1–1.0)
Monocytes Relative: 8 % (ref 3–12)
Neutro Abs: 3 10*3/uL (ref 1.7–7.7)
Neutrophils Relative %: 57 % (ref 43–77)
Platelets: 199 10*3/uL (ref 150–400)
RBC: 4.21 MIL/uL (ref 3.87–5.11)
RDW: 14.6 % (ref 11.5–15.5)
WBC: 5.3 10*3/uL (ref 4.0–10.5)

## 2014-10-02 LAB — POCT UA - MICROALBUMIN
Albumin/Creatinine Ratio, Urine, POC: 9.5
Creatinine, POC: 203.6 mg/dL
Microalbumin Ur, POC: 19.3 mg/L

## 2014-10-02 LAB — COMPREHENSIVE METABOLIC PANEL
ALK PHOS: 63 U/L (ref 33–130)
ALT: 15 U/L (ref 6–29)
AST: 21 U/L (ref 10–35)
Albumin: 4 g/dL (ref 3.6–5.1)
BILIRUBIN TOTAL: 0.3 mg/dL (ref 0.2–1.2)
BUN: 20 mg/dL (ref 7–25)
CALCIUM: 9.1 mg/dL (ref 8.6–10.4)
CO2: 27 mmol/L (ref 20–31)
Chloride: 103 mmol/L (ref 98–110)
Creat: 0.89 mg/dL (ref 0.60–0.93)
GLUCOSE: 144 mg/dL — AB (ref 65–99)
Potassium: 3.9 mmol/L (ref 3.5–5.3)
Sodium: 143 mmol/L (ref 135–146)
Total Protein: 6.3 g/dL (ref 6.1–8.1)

## 2014-10-02 LAB — POCT GLYCOSYLATED HEMOGLOBIN (HGB A1C): Hemoglobin A1C: 7.4

## 2014-10-02 MED ORDER — FLUTICASONE-SALMETEROL 100-50 MCG/DOSE IN AEPB: INHALATION_SPRAY | RESPIRATORY_TRACT | Status: DC

## 2014-10-02 MED ORDER — ALBUTEROL SULFATE HFA 108 (90 BASE) MCG/ACT IN AERS: 2.0000 | INHALATION_SPRAY | Freq: Four times a day (QID) | RESPIRATORY_TRACT | Status: DC | PRN

## 2014-10-02 MED ORDER — METFORMIN HCL 850 MG PO TABS: 850.0000 mg | ORAL_TABLET | Freq: Two times a day (BID) | ORAL | Status: DC

## 2014-10-02 MED ORDER — LISINOPRIL-HYDROCHLOROTHIAZIDE 20-12.5 MG PO TABS: ORAL_TABLET | ORAL | Status: DC

## 2014-10-02 MED ORDER — EZETIMIBE-SIMVASTATIN 10-20 MG PO TABS: 1.0000 | ORAL_TABLET | Freq: Every day | ORAL | Status: DC

## 2014-10-02 NOTE — Progress Notes (Signed)
  Subjective:    Patient ID: Parks Neptune, female    DOB: 04/04/1934, 80 y.o.   MRN: [id number]  RORI GOAR is a 80 y.o. female who presents for follow-up of Type 2 diabetes mellitus.  Home blood sugar records: Patient test one time a day Current symptoms/problems none Daily foot checks:yes   Any foot concerns: none Exercise: Very little Eye:02/18/14 She continues on her asthma medications and recently did require the use of more albuterol when she visited [place]. She would like refills on these medications. The following portions of the patient's history were reviewed and updated as appropriate: allergies, current medications, past medical history, past social history and problem list.  ROS as in subjective above.     Objective:    Physical Exam Alert and in no distress otherwise not examined.   Lab Review Diabetic Labs Latest Ref Rng 04/07/2014 12/02/2013 07/15/2013 03/04/2013 10/27/2012  HbA1c - 7.1 7.4 7.5 8.0 7.4  Chol 0 - 200 mg/dL - - 102 - -  HDL >39 mg/dL - - 52 - -  Calc LDL 0 - 99 mg/dL - - 40 - -  Triglycerides <150 mg/dL - - 50 - -  Creatinine 0.50 - 1.10 mg/dL - - 0.87 - -   BP/Weight 07/13/2014 04/07/2014 12/02/2013 07/15/2013 07/20/735  Systolic BP 106 269 485 462 703  Diastolic BP 70 80 70 70 74  Wt. (Lbs) 171 170 170 174 180  BMI 28.46 28.74 28.74 29.42 30.43   Foot/eye exam completion dates Latest Ref Rng 02/08/2014 07/15/2013  Eye Exam No Retinopathy No Retinopathy -  Foot exam Order - - -  Foot Form Completion - - Done  Hemoglobin A1c is 7.4  Kateryn  reports that she has never smoked. She has never used smokeless tobacco. She reports that she does not drink alcohol or use illicit drugs.     Assessment & Plan:    Type 2 diabetes mellitus without complication - Plan: POCT glycosylated hemoglobin (Hb A1C), POCT UA - Microalbumin, CBC with Differential/Platelet, Comprehensive metabolic panel, Lipid panel, metFORMIN (GLUCOPHAGE) 850 MG  tablet  Asthma, mild persistent, uncomplicated - Plan: albuterol (PROVENTIL HFA;VENTOLIN HFA) 108 (90 BASE) MCG/ACT inhaler, Fluticasone-Salmeterol (ADVAIR DISKUS) 100-50 MCG/DOSE AEPB  Hypertension associated with diabetes - Plan: CBC with Differential/Platelet, Comprehensive metabolic panel, lisinopril-hydrochlorothiazide (PRINZIDE,ZESTORETIC) 20-12.5 MG per tablet  Hyperlipidemia LDL goal <70 - Plan: Lipid panel, ezetimibe-simvastatin (VYTORIN) 10-20 MG per tablet  Allergic rhinitis, seasonal  Obesity (BMI 30-39.9) - Plan: CBC with Differential/Platelet, Comprehensive metabolic panel, Lipid panel  Need for prophylactic vaccination and inoculation against influenza - Plan: Flu vaccine HIGH DOSE PF (Fluzone High dose)   1. Rx changes: none 2. Education: Reviewed 'ABCs' of diabetes management (respective goals in parentheses):  A1C (<7), blood pressure (<130/80), and cholesterol (LDL <100). 3. Compliance at present is estimated to be good. Efforts to improve compliance (if necessary) will be directed at increased exercise. 4. Follow up: 4 months  5. Flu shot given with risks and benefits discussed.

## 2015-02-08 ENCOUNTER — Encounter: Payer: Self-pay | Admitting: Family Medicine

## 2015-02-08 ENCOUNTER — Ambulatory Visit (INDEPENDENT_AMBULATORY_CARE_PROVIDER_SITE_OTHER): Payer: Medicare Other | Admitting: Family Medicine

## 2015-02-08 VITALS — BP 160/82 | HR 84 | Ht 65.0 in | Wt 172.6 lb

## 2015-02-08 DIAGNOSIS — E669 Obesity, unspecified: Secondary | ICD-10-CM

## 2015-02-08 DIAGNOSIS — E785 Hyperlipidemia, unspecified: Secondary | ICD-10-CM | POA: Diagnosis not present

## 2015-02-08 DIAGNOSIS — E1169 Type 2 diabetes mellitus with other specified complication: Secondary | ICD-10-CM

## 2015-02-08 DIAGNOSIS — I10 Essential (primary) hypertension: Secondary | ICD-10-CM

## 2015-02-08 DIAGNOSIS — E1159 Type 2 diabetes mellitus with other circulatory complications: Secondary | ICD-10-CM | POA: Diagnosis not present

## 2015-02-08 DIAGNOSIS — E118 Type 2 diabetes mellitus with unspecified complications: Secondary | ICD-10-CM

## 2015-02-08 LAB — POCT GLYCOSYLATED HEMOGLOBIN (HGB A1C): Hemoglobin A1C: 7.2

## 2015-02-08 NOTE — Progress Notes (Signed)
  Subjective:    Patient ID: Linda Garcia, female    DOB: January 19, 1935, 81 y.o.   MRN: [id number]  Linda Garcia is a 81 y.o. female who presents for follow-up of Type 2 diabetes mellitus.  Patient is checking home blood sugars.   Home blood sugar records: BGs range between 130 and 170 How often is blood sugars being checked: once a day Current symptoms/problems include none and have been stable. Daily foot checks: yes    Any foot concerns: no Last eye exam: January 2016, Dr Einar Gip Exercise: The patient does not participate in regular exercise at present.  The following portions of the patient's history were reviewed and updated as appropriate: allergies, current medications, past medical history, past social history and problem list.  ROS as in subjective above.     Objective:    Physical Exam Alert and in no distress otherwise not examined.   Lab Review Diabetic Labs Latest Ref Rng 10/02/2014 04/07/2014 12/02/2013 07/15/2013 03/04/2013  HbA1c - 7.4 7.1 7.4 7.5 8.0  Chol 125 - 200 mg/dL 106(L) - - 102 -  HDL >=46 mg/dL 55 - - 52 -  Calc LDL <130 mg/dL 42 - - 40 -  Triglycerides <150 mg/dL 45 - - 50 -  Creatinine 0.60 - 0.93 mg/dL 0.89 - - 0.87 -   BP/Weight 10/02/2014 07/13/2014 04/07/2014 12/02/2013 [id number]  Systolic BP 123456 123456 123456 [id number] 123456  Diastolic BP 70 70 80 70 70  Wt. (Lbs) 175 171 170 170 174  BMI 29.12 28.46 28.74 28.74 29.42   Foot/eye exam completion dates Latest Ref Rng 10/02/2014 02/08/2014  Eye Exam No Retinopathy - No Retinopathy  Foot exam Order - - -  Foot Form Completion - Done -  A1C 7.2  Linda Garcia  reports that she has never smoked. She has never used smokeless tobacco. She reports that she does not drink alcohol or use illicit drugs.     Assessment & Plan:    Type 2 diabetes mellitus with complication, without long-term current use of insulin (HCC) - Plan: HgB A1c  Hypertension associated with diabetes (HCC)  Obesity (BMI  30-39.9)  Hyperlipidemia associated with type 2 diabetes mellitus ([place])   1. Rx changes: none 2. Education: Reviewed 'ABCs' of diabetes management (respective goals in parentheses):  A1C (<7), blood pressure (<130/80), and cholesterol (LDL <100). 3. Compliance at present is estimated to be good. Efforts to improve compliance (if necessary) will be directed at increased exercise. 4. Follow up: 4 months

## 2015-03-22 DIAGNOSIS — E119 Type 2 diabetes mellitus without complications: Secondary | ICD-10-CM | POA: Diagnosis not present

## 2015-03-22 LAB — HM DIABETES EYE EXAM

## 2015-03-28 ENCOUNTER — Encounter: Payer: Self-pay | Admitting: *Deleted

## 2015-03-29 ENCOUNTER — Other Ambulatory Visit: Payer: Self-pay | Admitting: Family Medicine

## 2015-04-20 DIAGNOSIS — Z1231 Encounter for screening mammogram for malignant neoplasm of breast: Secondary | ICD-10-CM | POA: Diagnosis not present

## 2015-04-20 LAB — HM MAMMOGRAPHY: HM Mammogram: NORMAL (ref 0–4)

## 2015-06-12 ENCOUNTER — Ambulatory Visit (INDEPENDENT_AMBULATORY_CARE_PROVIDER_SITE_OTHER): Payer: Medicare Other | Admitting: Family Medicine

## 2015-06-12 ENCOUNTER — Encounter: Payer: Self-pay | Admitting: Family Medicine

## 2015-06-12 VITALS — BP 120/70 | HR 68 | Wt 173.0 lb

## 2015-06-12 DIAGNOSIS — J4530 Mild persistent asthma, uncomplicated: Secondary | ICD-10-CM | POA: Diagnosis not present

## 2015-06-12 DIAGNOSIS — E1169 Type 2 diabetes mellitus with other specified complication: Secondary | ICD-10-CM

## 2015-06-12 DIAGNOSIS — J302 Other seasonal allergic rhinitis: Secondary | ICD-10-CM

## 2015-06-12 DIAGNOSIS — L03818 Cellulitis of other sites: Secondary | ICD-10-CM

## 2015-06-12 DIAGNOSIS — E1159 Type 2 diabetes mellitus with other circulatory complications: Secondary | ICD-10-CM

## 2015-06-12 DIAGNOSIS — I10 Essential (primary) hypertension: Secondary | ICD-10-CM | POA: Diagnosis not present

## 2015-06-12 DIAGNOSIS — E669 Obesity, unspecified: Secondary | ICD-10-CM

## 2015-06-12 DIAGNOSIS — E119 Type 2 diabetes mellitus without complications: Secondary | ICD-10-CM

## 2015-06-12 DIAGNOSIS — E785 Hyperlipidemia, unspecified: Secondary | ICD-10-CM | POA: Diagnosis not present

## 2015-06-12 LAB — POCT GLYCOSYLATED HEMOGLOBIN (HGB A1C)

## 2015-06-12 MED ORDER — DOXYCYCLINE HYCLATE 100 MG PO TABS: 100.0000 mg | ORAL_TABLET | Freq: Two times a day (BID) | ORAL | Status: DC

## 2015-06-12 NOTE — Progress Notes (Deleted)
  Subjective:    Patient ID: Linda Garcia, female    DOB: September 27, 1934, 81 y.o.   MRN: [id number]  Linda Garcia is a 81 y.o. female who presents for follow-up of Type 2 diabetes mellitus.  Patient is checking home blood sugars.   Home blood sugar records: BGs range between 120 and 150 How often is blood sugars being checked: once a day Current symptoms/problems include sore on leg and have been worsening. Daily foot checks: yes   Any foot concerns: no foot concerns but top of ankle has a sore Last eye exam: march 2017 Exercise: Little excerise  The following portions of the patient's history were reviewed and updated as appropriate: allergies, current medications, past medical history, past social history and problem list.  ROS as in subjective above.     Objective:    Physical Exam Alert and in no distress otherwise not examined.  There were no vitals taken for this visit.  Lab Review Diabetic Labs Latest Ref Rng 02/08/2015 10/02/2014 04/07/2014 12/02/2013 07/15/2013  HbA1c - 7.2 7.4 7.1 7.4 7.5  Chol 125 - 200 mg/dL - 106(L) - - 102  HDL >=46 mg/dL - 55 - - 52  Calc LDL <130 mg/dL - 42 - - 40  Triglycerides <150 mg/dL - 45 - - 50  Creatinine 0.60 - 0.93 mg/dL - 0.89 - - 0.87   BP/Weight 02/08/2015 10/02/2014 07/13/2014 04/07/2014 [id number]  Systolic BP 0000000 123456 123456 123456 [id number]  Diastolic BP 82 70 70 80 70  Wt. (Lbs) 172.6 175 171 170 170  BMI 28.72 29.12 28.46 28.74 28.74   Foot/eye exam completion dates Latest Ref Rng 03/22/2015 10/02/2014  Eye Exam No Retinopathy No Retinopathy -  Foot exam Order - - -  Foot Form Completion - - Done    Linda Garcia  reports that she has never smoked. She has never used smokeless tobacco. She reports that she does not drink alcohol or use illicit drugs.     Assessment & Plan:    No diagnosis found.  1. Rx changes: {none:33079} 2. Education: Reviewed 'ABCs' of diabetes management (respective goals in parentheses):  A1C (<7), blood  pressure (<130/80), and cholesterol (LDL <100). 3. Compliance at present is estimated to be {good/fair/poor:33178}. Efforts to improve compliance (if necessary) will be directed at {compliance:16716}. 4. Follow up: {NUMBERS; 0-10:33138} {time:11}

## 2015-06-12 NOTE — Patient Instructions (Signed)
Call me in 2 weeks and let me know how you're doing 

## 2015-06-12 NOTE — Progress Notes (Signed)
Subjective:    Patient ID: Linda Garcia, female    DOB: 1934-04-29, 81 y.o.   MRN: [id number]  Linda Garcia is a 81 y.o. female who presents for follow-up of Type 2 diabetes mellitus.  Patient is checking home blood sugars once / day.   Home blood sugar records: takes a morning fasting BG.  BGs range between 120 and 150 with an average around 130. Current diabetes related symptoms/problems: none Daily foot checks: yes   Any foot concerns: no foot concerns but top of ankle has a sore Last eye exam: march 2017 Exercise: Little exercise including stretching and a small exercise bike.  She does not like exercising outside due to allergies and temperature in winter.   Diet: She describes a mostly balanced diet without sodas.  She thinks she could improve on her diet by limiting sweets and doing a better job limiting carbohydrates.  she does have underlying allergies and asthma and these are under good control. She also has a 2 week history of a painful sore on right leg.  She does not recall any preceding trauma or puncture wound.  She also has a history of mild swelling in her lower legs.     The following portions of the patient's history were reviewed and updated as appropriate: allergies, current medications, past medical history, past social history and problem list.  ROS as in subjective above.     Objective:    Physical Exam Alert and in no distress.  Erythematous,doughy 3 cm area on medial right lower leg. The area is painful to touch.   Not hot  Blood pressure 120/70, pulse 68, weight 173 lb (78.472 kg).  Lab Review Diabetic Labs Latest Ref Rng 06/12/2015 02/08/2015 10/02/2014 04/07/2014 12/02/2013  HbA1c - 7.7% 7.2 7.4 7.1 7.4  Chol 125 - 200 mg/dL - - 106(L) - -  HDL >=46 mg/dL - - 55 - -  Calc LDL <130 mg/dL - - 42 - -  Triglycerides <150 mg/dL - - 45 - -  Creatinine 0.60 - 0.93 mg/dL - - 0.89 - -   BP/Weight 06/12/2015 02/08/2015 10/02/2014 07/13/2014 [id number]    Systolic BP 123456 0000000 123456 123456 123456  Diastolic BP 70 82 70 70 80  Wt. (Lbs) 173 172.6 175 171 170  BMI 28.79 28.72 29.12 28.46 28.74   Foot/eye exam completion dates Latest Ref Rng 03/22/2015 10/02/2014  Eye Exam No Retinopathy No Retinopathy -  Foot exam Order - - -  Foot Form Completion - - Done    Linda Garcia  reports that she has never smoked. She has never used smokeless tobacco. She reports that she does not drink alcohol or use illicit drugs.   A1c -- 7.7     Assessment & Plan:    Type 2 diabetes mellitus without complication, without long-term current use of insulin (HCC) - Plan: POCT glycosylated hemoglobin (Hb A1C)  Hypertension associated with diabetes ([place])  Hyperlipidemia associated with type 2 diabetes mellitus (HCC)  Allergic rhinitis, seasonal  Obesity (BMI 30-39.9)  Asthma, mild persistent, uncomplicated  Cellulitis of other specified site - Plan: doxycycline (VIBRA-TABS) 100 MG tablet  1. Rx changes: None today. However we discussed her increasing A1c and she decided today to try to address this issue with dietary changes including reducing carbohydrate intake and increased exercise.  We will try additional medications at the next visit if her A1c continues to worsen.   2. Education: Reviewed 'ABCs' of diabetes management (respective goals in parentheses):  A1C (<7), blood pressure (<130/80), and cholesterol (LDL <100). 3. Compliance at present is estimated to be fair. Efforts to improve compliance (if necessary) will be directed at dietary modifications: limiting sweets and carbs and increased exercise. 4. Follow up: 4 months    Cellulitis Will treat with a 2 week course of Doxycycline.     She is to call in 2 weeks and let me know how she is doing. History and physical exam conducted by Marylen Ponto (Medical Student) in conjunction with Dr. Redmond School.

## 2015-06-25 ENCOUNTER — Other Ambulatory Visit: Payer: Self-pay | Admitting: Family Medicine

## 2015-06-27 ENCOUNTER — Telehealth: Payer: Self-pay | Admitting: Family Medicine

## 2015-06-27 NOTE — Telephone Encounter (Signed)
Have her stay on the medicine

## 2015-06-27 NOTE — Telephone Encounter (Signed)
Pt called and stated that she was to call concerning the recent medication she was given. Pt states that med seems to be working. She states her legs are better but still just a little sore. Pt can be reached at [phone].

## 2015-06-27 NOTE — Telephone Encounter (Signed)
Left word for word message  

## 2015-09-27 ENCOUNTER — Other Ambulatory Visit: Payer: Self-pay | Admitting: Family Medicine

## 2015-09-27 DIAGNOSIS — I10 Essential (primary) hypertension: Secondary | ICD-10-CM

## 2015-09-27 DIAGNOSIS — E1159 Type 2 diabetes mellitus with other circulatory complications: Secondary | ICD-10-CM

## 2015-09-27 DIAGNOSIS — J4530 Mild persistent asthma, uncomplicated: Secondary | ICD-10-CM

## 2015-09-27 DIAGNOSIS — E785 Hyperlipidemia, unspecified: Secondary | ICD-10-CM

## 2015-10-16 ENCOUNTER — Ambulatory Visit: Payer: Medicare Other | Admitting: Family Medicine

## 2015-11-01 ENCOUNTER — Encounter: Payer: Self-pay | Admitting: Family Medicine

## 2015-11-01 ENCOUNTER — Ambulatory Visit (INDEPENDENT_AMBULATORY_CARE_PROVIDER_SITE_OTHER): Payer: Medicare Other | Admitting: Family Medicine

## 2015-11-01 VITALS — BP 118/70 | HR 78 | Ht 64.0 in | Wt 167.0 lb

## 2015-11-01 DIAGNOSIS — Z23 Encounter for immunization: Secondary | ICD-10-CM | POA: Diagnosis not present

## 2015-11-01 DIAGNOSIS — J4530 Mild persistent asthma, uncomplicated: Secondary | ICD-10-CM

## 2015-11-01 DIAGNOSIS — E1169 Type 2 diabetes mellitus with other specified complication: Secondary | ICD-10-CM | POA: Diagnosis not present

## 2015-11-01 DIAGNOSIS — E119 Type 2 diabetes mellitus without complications: Secondary | ICD-10-CM | POA: Diagnosis not present

## 2015-11-01 DIAGNOSIS — E1159 Type 2 diabetes mellitus with other circulatory complications: Secondary | ICD-10-CM | POA: Diagnosis not present

## 2015-11-01 DIAGNOSIS — E785 Hyperlipidemia, unspecified: Secondary | ICD-10-CM

## 2015-11-01 DIAGNOSIS — E669 Obesity, unspecified: Secondary | ICD-10-CM | POA: Diagnosis not present

## 2015-11-01 DIAGNOSIS — I10 Essential (primary) hypertension: Secondary | ICD-10-CM

## 2015-11-01 LAB — CBC WITH DIFFERENTIAL/PLATELET
BASOS ABS: 0 {cells}/uL (ref 0–200)
Basophils Relative: 0 %
EOS ABS: 188 {cells}/uL (ref 15–500)
Eosinophils Relative: 4 %
HCT: 35.8 % (ref 35.0–45.0)
Hemoglobin: 11.6 g/dL — ABNORMAL LOW (ref 11.7–15.5)
LYMPHS PCT: 27 %
Lymphs Abs: 1269 cells/uL (ref 850–3900)
MCH: 28 pg (ref 27.0–33.0)
MCHC: 32.4 g/dL (ref 32.0–36.0)
MCV: 86.5 fL (ref 80.0–100.0)
MONOS PCT: 6 %
MPV: 10.4 fL (ref 7.5–12.5)
Monocytes Absolute: 282 cells/uL (ref 200–950)
Neutro Abs: 2961 cells/uL (ref 1500–7800)
Neutrophils Relative %: 63 %
PLATELETS: 182 10*3/uL (ref 140–400)
RBC: 4.14 MIL/uL (ref 3.80–5.10)
RDW: 14.5 % (ref 11.0–15.0)
WBC: 4.7 10*3/uL (ref 4.0–10.5)

## 2015-11-01 LAB — LIPID PANEL
CHOL/HDL RATIO: 1.8 ratio (ref <=5.0)
CHOLESTEROL: 92 mg/dL — AB (ref 125–200)
HDL: 50 mg/dL (ref >=46)
LDL Cholesterol: 32 mg/dL (ref <130)
Triglycerides: 50 mg/dL (ref <150)
VLDL: 10 mg/dL (ref <30)

## 2015-11-01 LAB — COMPREHENSIVE METABOLIC PANEL
ALT: 14 U/L (ref 6–29)
AST: 21 U/L (ref 10–35)
Albumin: 4.2 g/dL (ref 3.6–5.1)
Alkaline Phosphatase: 59 U/L (ref 33–130)
BUN: 22 mg/dL (ref 7–25)
CHLORIDE: 103 mmol/L (ref 98–110)
CO2: 23 mmol/L (ref 20–31)
Calcium: 9.5 mg/dL (ref 8.6–10.4)
Creat: 1.06 mg/dL — ABNORMAL HIGH (ref 0.60–0.93)
GLUCOSE: 130 mg/dL — AB (ref 65–99)
POTASSIUM: 3.9 mmol/L (ref 3.5–5.3)
Sodium: 140 mmol/L (ref 135–146)
TOTAL PROTEIN: 6.6 g/dL (ref 6.1–8.1)
Total Bilirubin: 0.3 mg/dL (ref 0.2–1.2)

## 2015-11-01 LAB — POCT UA - MICROALBUMIN
ALBUMIN/CREATININE RATIO, URINE, POC: 7.4
CREATININE, POC: 169.8 mg/dL
Microalbumin Ur, POC: 7.4 mg/L

## 2015-11-01 LAB — POCT GLYCOSYLATED HEMOGLOBIN (HGB A1C): Hemoglobin A1C: 7.2

## 2015-11-01 MED ORDER — ALBUTEROL SULFATE HFA 108 (90 BASE) MCG/ACT IN AERS: 2.0000 | INHALATION_SPRAY | Freq: Four times a day (QID) | RESPIRATORY_TRACT | 0 refills | Status: DC | PRN

## 2015-11-01 NOTE — Progress Notes (Addendum)
  Subjective:    Patient ID: Linda Garcia, female    DOB: 02-01-1935, 81 y.o.   MRN: [id number]  Linda Garcia is a 81 y.o. female who presents for follow-up of Type 2 diabetes mellitus.  Patient is checking home blood sugars.   Home blood sugar records: 140 to 100 How often is blood sugars being checked: one time a day Current symptoms/problems None Daily foot checks: yes  Any foot concerns: none Last eye exam: 5/17 Exercise: none She does have underlying asthma and is doing well on Advair with occasional use of albuterol. She does need a refill on her albuterol. She continues to reduce nicely on her lisinopril as well as metformin. She is checking her blood sugars daily. The following portions of the patient's history were reviewed and updated as appropriate: allergies, current medications, past medical history, past social history and problem list.  ROS as in subjective above.     Objective:    Physical Exam Alert and in no distress. Cardiac exam shows regular rhythm without murmurs or gallops. Lungs are clear to auscultation. Diabetic foot exam performed and is normal.  Lab Review Diabetic Labs Latest Ref Rng & Units 06/12/2015 02/08/2015 10/02/2014 04/07/2014 12/02/2013  HbA1c - 7.7% 7.2 7.4 7.1 7.4  Microalbumin mg/L - - 19.3 - -  Micro/Creat Ratio - - - 9.5 - -  Chol 125 - 200 mg/dL - - 106(L) - -  HDL >=46 mg/dL - - 55 - -  Calc LDL <130 mg/dL - - 42 - -  Triglycerides <150 mg/dL - - 45 - -  Creatinine 0.60 - 0.93 mg/dL - - 0.89 - -   BP/Weight 06/12/2015 02/08/2015 10/02/2014 [id number]  Systolic BP 123456 0000000 123456 123456 123456  Diastolic BP 70 82 70 70 80  Wt. (Lbs) 173 172.6 175 171 170  BMI 28.79 28.72 29.12 28.46 28.74   Foot/eye exam completion dates Latest Ref Rng & Units 03/22/2015 10/02/2014  Eye Exam No Retinopathy No Retinopathy -  Foot exam Order - - -  Foot Form Completion - - Done   A1c 7.2 Linda Garcia  reports that she has never smoked. She has never  used smokeless tobacco. She reports that she does not drink alcohol or use drugs.     Assessment & Plan:    Need for prophylactic vaccination and inoculation against influenza - Plan: Flu vaccine HIGH DOSE PF (Fluzone High dose)  Asthma, mild persistent, uncomplicated - Plan: albuterol (PROVENTIL HFA;VENTOLIN HFA) 108 (90 Base) MCG/ACT inhaler  Hypertension associated with diabetes ([place]) - Plan: CBC with Differential/Platelet, Comprehensive metabolic panel  Type 2 diabetes mellitus without complication, without long-term current use of insulin ([place]) - Plan: POCT UA - Microalbumin, HgB A1c, CBC with Differential/Platelet, Comprehensive metabolic panel, Lipid panel  Hyperlipidemia associated with type 2 diabetes mellitus ([place]) - Plan: Lipid panel  Obesity (BMI 30-39.9) - Plan: CBC with Differential/Platelet, Comprehensive metabolic panel   1. Rx changes: none 2. Education: Reviewed 'ABCs' of diabetes management (respective goals in parentheses):  A1C (<7), blood pressure (<130/80), and cholesterol (LDL <100). 3. Compliance at present is estimated to be good. Efforts to improve compliance (if necessary) will be directed at increased exercise. 4. Follow up: 4 months  Recent blood work showed quite low lipids. I will stop her Vytorin and switch her to 10 mg of Lipitor.

## 2015-11-02 ENCOUNTER — Other Ambulatory Visit: Payer: Self-pay

## 2015-11-02 MED ORDER — ATORVASTATIN CALCIUM 10 MG PO TABS: 10.0000 mg | ORAL_TABLET | Freq: Every day | ORAL | 3 refills | Status: DC

## 2015-11-02 NOTE — Addendum Note (Signed)
Addended by: Denita Lung on: 11/02/2015 08:19 AM   Modules accepted: Orders

## 2015-12-22 ENCOUNTER — Other Ambulatory Visit: Payer: Self-pay | Admitting: Family Medicine

## 2016-03-06 ENCOUNTER — Ambulatory Visit: Payer: Medicare Other | Admitting: Family Medicine

## 2016-03-13 ENCOUNTER — Encounter: Payer: Self-pay | Admitting: Family Medicine

## 2016-03-13 ENCOUNTER — Ambulatory Visit (INDEPENDENT_AMBULATORY_CARE_PROVIDER_SITE_OTHER): Payer: Medicare Other | Admitting: Family Medicine

## 2016-03-13 VITALS — BP 122/70 | HR 70 | Wt 167.0 lb

## 2016-03-13 DIAGNOSIS — J302 Other seasonal allergic rhinitis: Secondary | ICD-10-CM | POA: Diagnosis not present

## 2016-03-13 DIAGNOSIS — E785 Hyperlipidemia, unspecified: Secondary | ICD-10-CM | POA: Diagnosis not present

## 2016-03-13 DIAGNOSIS — E119 Type 2 diabetes mellitus without complications: Secondary | ICD-10-CM | POA: Diagnosis not present

## 2016-03-13 DIAGNOSIS — E669 Obesity, unspecified: Secondary | ICD-10-CM | POA: Diagnosis not present

## 2016-03-13 DIAGNOSIS — I10 Essential (primary) hypertension: Secondary | ICD-10-CM | POA: Diagnosis not present

## 2016-03-13 DIAGNOSIS — J209 Acute bronchitis, unspecified: Secondary | ICD-10-CM

## 2016-03-13 DIAGNOSIS — E1159 Type 2 diabetes mellitus with other circulatory complications: Secondary | ICD-10-CM | POA: Diagnosis not present

## 2016-03-13 DIAGNOSIS — E1169 Type 2 diabetes mellitus with other specified complication: Secondary | ICD-10-CM

## 2016-03-13 DIAGNOSIS — I152 Hypertension secondary to endocrine disorders: Secondary | ICD-10-CM

## 2016-03-13 LAB — POCT GLYCOSYLATED HEMOGLOBIN (HGB A1C): HEMOGLOBIN A1C: 7.8

## 2016-03-13 MED ORDER — AZITHROMYCIN 500 MG PO TABS: 500.0000 mg | ORAL_TABLET | Freq: Every day | ORAL | 0 refills | Status: DC

## 2016-03-13 NOTE — Patient Instructions (Signed)
Call me in a week if you're still having trouble with the coughing

## 2016-03-13 NOTE — Progress Notes (Signed)
Subjective:    Patient ID: Linda Garcia, female    DOB: [birth year], 82 y.o.   MRN: [id number]  Linda Garcia is a 82 y.o. female who presents for follow-up of Type 2 diabetes mellitus.  Patient is checking home blood sugars.   Home blood sugar records: 130 to 140 How often is blood sugars being checked: once a day Current symptoms/problem none Daily foot checks: yes   Any foot concerns: dry Last eye exam: 2/17 Exercise: walking Admits to dietary indiscretion over the holidays. She does complain of a two-week history of chest congestion and coughing but no fever, chills, earache or sore throat. She has been using here albuterol more regularly recently. She does have underlying asthma and continues on her inhaler. She does have underlying allergies but treats this on an as-needed basis. She continues on her lisinopril/HCTZ, Allegra, Lipitor, Advair and albuterol. The following portions of the patient's history were reviewed and updated as appropriate: allergies, current medications, past medical history, past social history and problem list.  ROS as in subjective above.     Objective:    Physical Exam Alert and in no distress. Tympanic membranes and canals are normal. Pharyngeal area is normal. Neck is supple without adenopathy or thyromegaly. Cardiac exam shows a regular sinus rhythm without murmurs or gallops. Lungs are clear to auscultation.   Lab Review Diabetic Labs Latest Ref Rng & Units 11/01/2015 06/12/2015 02/08/2015 10/02/2014 04/07/2014  HbA1c - 7.2 7.7% 7.2 7.4 7.1  Microalbumin mg/L 7.4 - - 19.3 -  Micro/Creat Ratio - 7.4 - - 9.5 -  Chol 125 - 200 mg/dL 92(L) - - 106(L) -  HDL >=46 mg/dL 50 - - 55 -  Calc LDL <130 mg/dL 32 - - 42 -  Triglycerides <150 mg/dL 50 - - 45 -  Creatinine 0.60 - 0.93 mg/dL 1.06(H) - - 0.89 -   BP/Weight 11/01/2015 06/12/2015 02/08/2015 [id number]  Systolic BP 123456 123456 0000000 123456 123456  Diastolic BP 70 70 82 70 70  Wt. (Lbs) 167 173  172.6 175 171  BMI 28.67 28.79 28.72 29.12 28.46   Foot/eye exam completion dates Latest Ref Rng & Units 11/01/2015 03/22/2015  Eye Exam No Retinopathy - No Retinopathy  Foot exam Order - - -  Foot Form Completion - Done -  A1c is 7.8.  Linda Garcia  reports that she has never smoked. She has never used smokeless tobacco. She reports that she does not drink alcohol or use drugs.     Assessment & Plan:    Type 2 diabetes mellitus without complication, without long-term current use of insulin (HCC) - Plan: HgB A1c  Acute bronchitis, unspecified organism - Plan: azithromycin (ZITHROMAX) 500 MG tablet  Chronic seasonal allergic rhinitis, unspecified trigger  Hypertension associated with diabetes ([place])  Hyperlipidemia associated with type 2 diabetes mellitus ([place])  Obesity (BMI 30-39.9)   1. Rx changes: Azithromycin written but no change in her diabetes medications. 2. Education: Reviewed 'ABCs' of diabetes management (respective goals in parentheses):  A1C (<7), blood pressure (<130/80), and cholesterol (LDL <100). 3. Compliance at present is estimated to be fair. Efforts to improve compliance (if necessary) will be directed at increased exercise. 4. Follow up: 4 months  5. She will call if not entirely better after 1 week on being on the azithromycin. Also discussed her elevated A1c. She plans to be more diligent with her diet and exercise. She wanted to blame this on the holidays. 6. Prescription written for her to  get TDaP at the store.

## 2016-04-21 DIAGNOSIS — E119 Type 2 diabetes mellitus without complications: Secondary | ICD-10-CM | POA: Diagnosis not present

## 2016-04-21 LAB — HM DIABETES EYE EXAM

## 2016-04-28 ENCOUNTER — Encounter: Payer: Self-pay | Admitting: Family Medicine

## 2016-05-12 DIAGNOSIS — Z1231 Encounter for screening mammogram for malignant neoplasm of breast: Secondary | ICD-10-CM | POA: Diagnosis not present

## 2016-05-12 LAB — HM MAMMOGRAPHY

## 2016-05-22 ENCOUNTER — Encounter: Payer: Self-pay | Admitting: Family Medicine

## 2016-06-17 ENCOUNTER — Other Ambulatory Visit: Payer: Self-pay | Admitting: Family Medicine

## 2016-06-17 DIAGNOSIS — J4530 Mild persistent asthma, uncomplicated: Secondary | ICD-10-CM

## 2016-06-21 ENCOUNTER — Other Ambulatory Visit: Payer: Self-pay | Admitting: Family Medicine

## 2016-06-23 ENCOUNTER — Other Ambulatory Visit: Payer: Self-pay | Admitting: Family Medicine

## 2016-06-23 DIAGNOSIS — E1159 Type 2 diabetes mellitus with other circulatory complications: Secondary | ICD-10-CM

## 2016-06-23 DIAGNOSIS — I10 Essential (primary) hypertension: Principal | ICD-10-CM

## 2016-07-16 ENCOUNTER — Encounter: Payer: Self-pay | Admitting: Family Medicine

## 2016-07-16 ENCOUNTER — Ambulatory Visit (INDEPENDENT_AMBULATORY_CARE_PROVIDER_SITE_OTHER): Payer: Medicare Other | Admitting: Family Medicine

## 2016-07-16 VITALS — BP 118/74 | HR 84 | Ht 64.0 in | Wt 170.0 lb

## 2016-07-16 DIAGNOSIS — E669 Obesity, unspecified: Secondary | ICD-10-CM | POA: Diagnosis not present

## 2016-07-16 DIAGNOSIS — Z23 Encounter for immunization: Secondary | ICD-10-CM

## 2016-07-16 DIAGNOSIS — E119 Type 2 diabetes mellitus without complications: Secondary | ICD-10-CM | POA: Diagnosis not present

## 2016-07-16 DIAGNOSIS — E1159 Type 2 diabetes mellitus with other circulatory complications: Secondary | ICD-10-CM

## 2016-07-16 DIAGNOSIS — J4530 Mild persistent asthma, uncomplicated: Secondary | ICD-10-CM | POA: Diagnosis not present

## 2016-07-16 DIAGNOSIS — E785 Hyperlipidemia, unspecified: Secondary | ICD-10-CM

## 2016-07-16 DIAGNOSIS — E1169 Type 2 diabetes mellitus with other specified complication: Secondary | ICD-10-CM | POA: Diagnosis not present

## 2016-07-16 DIAGNOSIS — I10 Essential (primary) hypertension: Secondary | ICD-10-CM | POA: Diagnosis not present

## 2016-07-16 DIAGNOSIS — J302 Other seasonal allergic rhinitis: Secondary | ICD-10-CM

## 2016-07-16 DIAGNOSIS — I152 Hypertension secondary to endocrine disorders: Secondary | ICD-10-CM

## 2016-07-16 LAB — POCT GLYCOSYLATED HEMOGLOBIN (HGB A1C): Hemoglobin A1C: 7.7

## 2016-07-16 NOTE — Progress Notes (Signed)
Subjective:   HPI  Linda Garcia is a 82 y.o. female who presents for Chief Complaint  Patient presents with  . Medicare Wellness    med check plus    Medical care team includes: Denita Lung, MD here for primary care   Preventative care:  Last ophthalmology visit:04/21/16 Last dental visit:2/18 Last colonoscopy:02/17/13 Last mammogram:05/12/16 Last pap: N/A Last EKG: Last labs:11/01/15  Prior vaccinations:  TD or Tdap:11/09/15 Influenza:11/01/15 Pneumococcal:23:02/04/08,07/15/13 Shingles/Zostavax: 08/03/12  Advanced directive: No. Information given. Concerns: She continues on lisinopril/HCTZ and having no difficulty with that. She has also taking metformin. She does check her blood sugars periodically. Her exercise is quite minimal. She does have underlying allergies and asthma and is stable on her present medication regimen. She is also taking Lipitor and having no aches or pains with that. She continues to work 2 days per week and is considering retiring again. She has no other concerns or complaints.  Reviewed their medical, surgical, family, social, medication, and allergy history and updated chart as appropriate.  Past Medical History:  Diagnosis Date  . Asthma   . Diabetes mellitus   . Diverticulosis   . Dyslipidemia   . Hypertension   . Neuropathy in diabetes ([place])   . Small bowel obstruction     Past Surgical History:  Procedure Laterality Date  . COLONOSCOPY  2009    Social History   Social History  . Marital status: Married    Spouse name: N/A  . Number of children: N/A  . Years of education: N/A   Occupational History  . Not on file.   Social History Main Topics  . Smoking status: Never Smoker  . Smokeless tobacco: Never Used  . Alcohol use No  . Drug use: No  . Sexual activity: No   Other Topics Concern  . Not on file   Social History Narrative  . No narrative on file    Family History  Problem Relation Age of Onset  .  Diabetes Mother   . Hypertension Mother   . Heart disease Father      Current Outpatient Prescriptions:  .  ADVAIR DISKUS 100-50 MCG/DOSE AEPB, USE 1 INHALATION EVERY 12 HOURS, Disp: 180 each, Rfl: 2 .  aspirin 81 MG tablet, Take 81 mg by mouth daily.  , Disp: , Rfl:  .  atorvastatin (LIPITOR) 10 MG tablet, Take 1 tablet (10 mg total) by mouth daily., Disp: 90 tablet, Rfl: 3 .  fexofenadine (ALLEGRA) 180 MG tablet, Take 180 mg by mouth daily., Disp: , Rfl:  .  fish oil-omega-3 fatty acids 1000 MG capsule, Take 2 g by mouth daily.  , Disp: , Rfl:  .  lisinopril-hydrochlorothiazide (PRINZIDE,ZESTORETIC) 20-12.5 MG tablet, TAKE 1 TABLET DAILY, Disp: 90 tablet, Rfl: 2 .  metFORMIN (GLUCOPHAGE) 850 MG tablet, TAKE 1 TABLET TWICE A DAY WITH MEALS, Disp: 180 tablet, Rfl: 1 .  Multiple Vitamins-Minerals (MULTIVITAMIN WITH MINERALS) tablet, Take 1 tablet by mouth daily.  , Disp: , Rfl:  .  PROAIR HFA 108 (90 Base) MCG/ACT inhaler, USE 2 INHALATIONS EVERY 6 HOURS AS NEEDED FOR WHEEZING OR SHORTNESS OF BREATH, Disp: 3 Inhaler, Rfl: 1 No current facility-administered medications for this visit.   Facility-Administered Medications Ordered in Other Visits:  .  influenza  inactive virus vaccine (FLUZONE/FLUARIX) injection 0.5 mL, 0.5 mL, Intramuscular, Once, Rita Ohara, MD  No Known Allergies     Review of Systems Negative except as above    Objective:   General  appearance: alert, no distress, WD/WN, African American female Skin: Seborrheic keratoses noted HEENT: normocephalic, conjunctiva/corneas normal, sclerae anicteric, PERRLA, EOMi, nares patent, no discharge or erythema, pharynx normal Oral cavity: MMM, tongue normal, teeth normal Neck: supple, no lymphadenopathy, no thyromegaly, no masses, normal ROM, no bruits Chest: non tender, normal shape and expansion Heart: RRR, normal S1, S2, no murmurs Lungs: CTA bilaterally, no wheezes, rhonchi, or rales Abdomen: +bs, soft, non tender, non  distended, no masses, no hepatomegaly, no splenomegaly, no bruits Musculoskeletal: upper extremities non tender, no obvious deformity, normal ROM throughout, lower extremities non tender, no obvious deformity, normal ROM throughout Extremities: no edema, no cyanosis, no clubbing Pulses: 2+ symmetric, upper and lower extremities, normal cap refill Neurological: alert, oriented x 3, CN2-12 intact, strength normal upper extremities and lower extremities, sensation normal throughout, DTRs 2+ throughout, no cerebellar signs, gait normal Psychiatric: normal affect, behavior normal, pleasant   A1c is 7.7  Assessment and Plan :    Need for vaccination against Streptococcus pneumoniae - Plan: Pneumococcal conjugate vaccine 13-valent  Seasonal allergic rhinitis, unspecified trigger  Mild persistent asthma without complication  Type 2 diabetes mellitus without complication, without long-term current use of insulin (HCC) - Plan: HgB A1c  Hyperlipidemia associated with type 2 diabetes mellitus ([place])  Hypertension associated with diabetes ([place])  Obesity (BMI 30-39.9) She is stable on the present medication regimen for the above diagnoses. Did encourage her to become more physically active. Will also recommended she get the Shingrix vaccine.  Physical exam - discussed and counseled on healthy lifestyle, diet, exercise, preventative care, vaccinations, sick and well care, proper use of emergency dept and after hours care, and addressed their concerns.

## 2016-08-13 ENCOUNTER — Telehealth: Payer: Self-pay

## 2016-08-13 DIAGNOSIS — Z79899 Other long term (current) drug therapy: Secondary | ICD-10-CM

## 2016-08-13 NOTE — Telephone Encounter (Signed)
Order entered for [facility] referral. If they are not able to assist pt then we can use [employer]. Attempted call to pt with busy tone.

## 2016-08-13 NOTE — Telephone Encounter (Signed)
FYI     I spoke to pt this morning about her meds. She was attempting to refill Vytorin. Advised her that she was not to be taking Vytorin and it was replaced with Lipitor. Pt then wanted to go through all her meds because she was confused as to what she should be taking and what each med was for. We went through this 2-3 times because she got confused about the meds but she seemed to have it all together by the end of the call.  She may benefit from PharmD services through [facility]. See staff message sent out today.

## 2016-08-13 NOTE — Telephone Encounter (Signed)
Set this up 

## 2016-08-14 NOTE — Telephone Encounter (Signed)
Spoke with pt- [facility] is not able to accept her as referral due to it is not a covered service through her insurance.   Pt states her medication confusion has been straighten out. Advised pt if she has further confusion to please let me know and we can set her up with [facility]. She is agreeable. [name]

## 2016-10-02 ENCOUNTER — Telehealth: Payer: Self-pay

## 2016-10-02 ENCOUNTER — Other Ambulatory Visit: Payer: Self-pay | Admitting: Family Medicine

## 2016-10-02 ENCOUNTER — Other Ambulatory Visit: Payer: Self-pay

## 2016-10-02 DIAGNOSIS — J4530 Mild persistent asthma, uncomplicated: Secondary | ICD-10-CM

## 2016-10-02 MED ORDER — FLUTICASONE-SALMETEROL 100-50 MCG/DOSE IN AEPB: INHALATION_SPRAY | RESPIRATORY_TRACT | 2 refills | Status: DC

## 2016-10-02 NOTE — Telephone Encounter (Signed)
Pt needs refill of Advair called to [facility]. Linda Garcia

## 2016-10-03 MED ORDER — FLUTICASONE-SALMETEROL 100-50 MCG/DOSE IN AEPB: INHALATION_SPRAY | RESPIRATORY_TRACT | 0 refills | Status: DC

## 2016-10-03 NOTE — Telephone Encounter (Signed)
PT called requesting medication be sent to [facility] at [facility]. [name]

## 2016-10-09 ENCOUNTER — Other Ambulatory Visit: Payer: Self-pay | Admitting: Family Medicine

## 2016-10-09 DIAGNOSIS — E785 Hyperlipidemia, unspecified: Principal | ICD-10-CM

## 2016-10-09 DIAGNOSIS — E1169 Type 2 diabetes mellitus with other specified complication: Secondary | ICD-10-CM

## 2016-10-24 ENCOUNTER — Telehealth: Payer: Self-pay | Admitting: Family Medicine

## 2016-10-24 NOTE — Telephone Encounter (Signed)
Pt left message that Advair was denied to [facility]. Please refill

## 2016-10-27 ENCOUNTER — Other Ambulatory Visit: Payer: Self-pay

## 2016-10-27 MED ORDER — FLUTICASONE-SALMETEROL 100-50 MCG/DOSE IN AEPB: INHALATION_SPRAY | RESPIRATORY_TRACT | 0 refills | Status: DC

## 2016-10-27 NOTE — Telephone Encounter (Signed)
Medication renewed. Linda Garcia

## 2016-11-18 ENCOUNTER — Ambulatory Visit: Payer: Medicare Other | Admitting: Family Medicine

## 2016-12-02 ENCOUNTER — Encounter: Payer: Self-pay | Admitting: Family Medicine

## 2016-12-02 ENCOUNTER — Ambulatory Visit (INDEPENDENT_AMBULATORY_CARE_PROVIDER_SITE_OTHER): Payer: Medicare Other | Admitting: Family Medicine

## 2016-12-02 VITALS — BP 122/80 | HR 74 | Wt 174.2 lb

## 2016-12-02 DIAGNOSIS — E785 Hyperlipidemia, unspecified: Secondary | ICD-10-CM

## 2016-12-02 DIAGNOSIS — Z23 Encounter for immunization: Secondary | ICD-10-CM | POA: Diagnosis not present

## 2016-12-02 DIAGNOSIS — I10 Essential (primary) hypertension: Secondary | ICD-10-CM

## 2016-12-02 DIAGNOSIS — I152 Hypertension secondary to endocrine disorders: Secondary | ICD-10-CM

## 2016-12-02 DIAGNOSIS — E669 Obesity, unspecified: Secondary | ICD-10-CM | POA: Diagnosis not present

## 2016-12-02 DIAGNOSIS — J4530 Mild persistent asthma, uncomplicated: Secondary | ICD-10-CM | POA: Diagnosis not present

## 2016-12-02 DIAGNOSIS — E1159 Type 2 diabetes mellitus with other circulatory complications: Secondary | ICD-10-CM | POA: Diagnosis not present

## 2016-12-02 DIAGNOSIS — E1169 Type 2 diabetes mellitus with other specified complication: Secondary | ICD-10-CM

## 2016-12-02 DIAGNOSIS — E119 Type 2 diabetes mellitus without complications: Secondary | ICD-10-CM | POA: Diagnosis not present

## 2016-12-02 LAB — CBC WITH DIFFERENTIAL/PLATELET
BASOS ABS: 23 {cells}/uL (ref 0–200)
Basophils Relative: 0.4 %
EOS ABS: 262 {cells}/uL (ref 15–500)
EOS PCT: 4.6 %
HCT: 36.5 % (ref 35.0–45.0)
HEMOGLOBIN: 12.2 g/dL (ref 11.7–15.5)
Lymphs Abs: 1317 cells/uL (ref 850–3900)
MCH: 28.2 pg (ref 27.0–33.0)
MCHC: 33.4 g/dL (ref 32.0–36.0)
MCV: 84.5 fL (ref 80.0–100.0)
MONOS PCT: 8.6 %
MPV: 10.4 fL (ref 7.5–12.5)
NEUTROS ABS: 3608 {cells}/uL (ref 1500–7800)
Neutrophils Relative %: 63.3 %
PLATELETS: 186 10*3/uL (ref 140–400)
RBC: 4.32 10*6/uL (ref 3.80–5.10)
RDW: 13.5 % (ref 11.0–15.0)
TOTAL LYMPHOCYTE: 23.1 %
WBC mixed population: 490 cells/uL (ref 200–950)
WBC: 5.7 10*3/uL (ref 3.8–10.8)

## 2016-12-02 LAB — COMPREHENSIVE METABOLIC PANEL
AG Ratio: 1.5 (calc) (ref 1.0–2.5)
ALT: 28 U/L (ref 6–29)
AST: 25 U/L (ref 10–35)
Albumin: 4.1 g/dL (ref 3.6–5.1)
Alkaline phosphatase (APISO): 101 U/L (ref 33–130)
BUN/Creatinine Ratio: 21 (calc) (ref 6–22)
BUN: 19 mg/dL (ref 7–25)
CO2: 28 mmol/L (ref 20–32)
CREATININE: 0.91 mg/dL — AB (ref 0.60–0.88)
Calcium: 9.3 mg/dL (ref 8.6–10.4)
Chloride: 103 mmol/L (ref 98–110)
GLUCOSE: 171 mg/dL — AB (ref 65–99)
Globulin: 2.8 g/dL (calc) (ref 1.9–3.7)
Potassium: 3.8 mmol/L (ref 3.5–5.3)
SODIUM: 141 mmol/L (ref 135–146)
TOTAL PROTEIN: 6.9 g/dL (ref 6.1–8.1)
Total Bilirubin: 0.4 mg/dL (ref 0.2–1.2)

## 2016-12-02 LAB — POCT GLYCOSYLATED HEMOGLOBIN (HGB A1C): Hemoglobin A1C: 8.1

## 2016-12-02 LAB — LIPID PANEL
Cholesterol: 139 mg/dL (ref <200)
HDL: 61 mg/dL (ref >50)
LDL Cholesterol (Calc): 64 mg/dL (calc)
NON-HDL CHOLESTEROL (CALC): 78 mg/dL (ref <130)
TRIGLYCERIDES: 61 mg/dL (ref <150)
Total CHOL/HDL Ratio: 2.3 (calc) (ref <5.0)

## 2016-12-02 MED ORDER — PIOGLITAZONE HCL 30 MG PO TABS: 30.0000 mg | ORAL_TABLET | Freq: Every day | ORAL | 1 refills | Status: DC

## 2016-12-02 NOTE — Progress Notes (Signed)
Subjective:    Patient ID: Linda Garcia, female    DOB: Mar 08, 1934, 82 y.o.   MRN: [id number]  Linda Garcia is a 82 y.o. female who presents for follow-up of Type 2 diabetes mellitus.  Patient is checking home blood sugars.   Home blood sugar records: yes How often is blood sugars being checked: good and bad 130 's Current symptoms/problems include feeling tried and have been unchanged. Daily foot checks: yes  Any foot concerns: no  Last eye exam: May 2018 Exercise: yes off and on   She continues on atorvastatin and is having aches or pains with that. She is also taking lisinopril/HCTZ. She does have underlying asthma and this seems to be under good control. Continues on metformin without difficulty. Her exercise is quite minimal. The following portions of the patient's history were reviewed and updated as appropriate: allergies, current medications, past medical history, past social history and problem list.  ROS as in subjective above.     Objective:    Physical Exam Alert and in no distress . Cardiac exam shows regular rhythm without murmurs or gallops. Lungs clear to auscultation. Foot exam done and does show some calluses otherwise normal    Lab Review Diabetic Labs Latest Ref Rng & Units 07/16/2016 03/13/2016 11/01/2015 06/12/2015 02/08/2015  HbA1c - 7.7 7.8 7.2 7.7% 7.2  Microalbumin mg/L - - 7.4 - -  Micro/Creat Ratio - - - 7.4 - -  Chol 125 - 200 mg/dL - - 92(L) - -  HDL >=46 mg/dL - - 50 - -  Calc LDL <130 mg/dL - - 32 - -  Triglycerides <150 mg/dL - - 50 - -  Creatinine 0.60 - 0.93 mg/dL - - 1.06(H) - -   BP/Weight 07/16/2016 03/13/2016 11/01/2015 03/11/7122 06/10/996  Systolic BP 338 250 539 767 341  Diastolic BP 74 70 70 70 82  Wt. (Lbs) 170 167 167 173 172.6  BMI 29.18 28.67 28.67 28.79 28.72   Foot/eye exam completion dates Latest Ref Rng & Units 04/21/2016 11/01/2015  Eye Exam No Retinopathy No Retinopathy -  Foot exam Order - - -  Foot Form Completion - -  Done  A1c is 8.1  Linda Garcia  reports that she has never smoked. She has never used smokeless tobacco. She reports that she does not drink alcohol or use drugs.     Assessment & Plan:    Type 2 diabetes mellitus without complication, without long-term current use of insulin (HCC) - Plan: pioglitazone (ACTOS) 30 MG tablet, CBC with Differential/Platelet, Comprehensive metabolic panel, Lipid panel, HgB A1c, CANCELED: POCT UA - Microalbumin  Need for influenza vaccination - Plan: Flu vaccine HIGH DOSE PF (Fluzone High dose)  Mild persistent asthma without complication  Hyperlipidemia associated with type 2 diabetes mellitus ([place]) - Plan: Lipid panel  Hypertension associated with diabetes ([place]) - Plan: CBC with Differential/Platelet, Comprehensive metabolic panel  Obesity (BMI 30-39.9) - Plan: CBC with Differential/Platelet, Comprehensive metabolic panel, Lipid panel  I discussed the fact that her A1c has gone up slightly. I will add Actos to her regimen to try and keep her costs down. Strongly encouraged her to get more involved with physical activity. She was also given a flu shot. 1. Rx changes: Actos added to the regimen 2. Education: Reviewed 'ABCs' of diabetes management (respective goals in parentheses):  A1C (<7), blood pressure (<130/80), and cholesterol (LDL <100). 3. Compliance at present is estimated to be fair. Efforts to improve compliance (if necessary) will be directed  at increased exercise. 4. Follow up: 4 months

## 2016-12-02 NOTE — Patient Instructions (Signed)
20 minutes of something physical every day 

## 2016-12-03 MED ORDER — LISINOPRIL-HYDROCHLOROTHIAZIDE 20-12.5 MG PO TABS: 1.0000 | ORAL_TABLET | Freq: Every day | ORAL | 3 refills | Status: DC

## 2016-12-03 MED ORDER — METFORMIN HCL 850 MG PO TABS: 850.0000 mg | ORAL_TABLET | Freq: Two times a day (BID) | ORAL | 1 refills | Status: DC

## 2016-12-03 NOTE — Addendum Note (Signed)
Addended by: Denita Lung on: 12/03/2016 11:48 AM   Modules accepted: Orders

## 2017-01-22 ENCOUNTER — Telehealth: Payer: Self-pay | Admitting: Family Medicine

## 2017-01-22 MED ORDER — FLUTICASONE-SALMETEROL 100-50 MCG/DOSE IN AEPB: INHALATION_SPRAY | RESPIRATORY_TRACT | 1 refills | Status: DC

## 2017-01-22 NOTE — Telephone Encounter (Signed)
Rcvd refill request from [facility] for Advair Diskus 60's 100/50

## 2017-04-02 ENCOUNTER — Ambulatory Visit (INDEPENDENT_AMBULATORY_CARE_PROVIDER_SITE_OTHER): Payer: Medicare Other | Admitting: Family Medicine

## 2017-04-02 ENCOUNTER — Ambulatory Visit: Payer: Medicare Other | Admitting: Family Medicine

## 2017-04-02 ENCOUNTER — Encounter: Payer: Self-pay | Admitting: Family Medicine

## 2017-04-02 VITALS — BP 120/80 | HR 80 | Wt 179.4 lb

## 2017-04-02 DIAGNOSIS — E1169 Type 2 diabetes mellitus with other specified complication: Secondary | ICD-10-CM

## 2017-04-02 DIAGNOSIS — I10 Essential (primary) hypertension: Secondary | ICD-10-CM

## 2017-04-02 DIAGNOSIS — E1159 Type 2 diabetes mellitus with other circulatory complications: Secondary | ICD-10-CM

## 2017-04-02 DIAGNOSIS — E785 Hyperlipidemia, unspecified: Secondary | ICD-10-CM | POA: Diagnosis not present

## 2017-04-02 DIAGNOSIS — E119 Type 2 diabetes mellitus without complications: Secondary | ICD-10-CM | POA: Diagnosis not present

## 2017-04-02 DIAGNOSIS — I152 Hypertension secondary to endocrine disorders: Secondary | ICD-10-CM

## 2017-04-02 LAB — POCT GLYCOSYLATED HEMOGLOBIN (HGB A1C): Hemoglobin A1C: 6.5

## 2017-04-02 MED ORDER — PIOGLITAZONE HCL 15 MG PO TABS: 15.0000 mg | ORAL_TABLET | Freq: Every day | ORAL | 1 refills | Status: DC

## 2017-04-02 NOTE — Progress Notes (Signed)
Subjective:    Patient ID: Parks Neptune, female    DOB: 1934-03-17, 83 y.o.   MRN: [id number]  EMMALEAH MERONEY is a 83 y.o. female who presents for follow-up of Type 2 diabetes mellitus.  She is seeing me today for her diabetes follow-up due to her primary care not being in the office this week. Review of her chart shows that her hemoglobin A1c was 8.1% at her last visit 4 months ago and this had increased from her previous visit.  She has been taking metformin and Actos was added to her medication regimen. She was also encouraged to increase her physical activity but she has not done this. States she cannot get outside due to asthma.   States she felt like her feet and legs were swelling right after starting on Actos and those symptoms resolved. Complains of fatigue since starting on Actos and this is still present. She is still taking the Actos daily but wants to stop it.   Denies fever, chills, chest pain, palpitations, shortness of breath, orthopnea, cough, abdominal pain, N/V/D. No urinary symptoms.   States she is taking a statin daily.  Not checking BP at home. States her BP machine is broken.    Patient is checking home blood sugars.   Home blood sugar records: BGs range between 113 and 147 fasting  How often is blood sugars being checked: once a day Current symptoms include: none. Patient denies nausea, visual disturbances, vomiting and weight loss.  Patient is checking their feet daily. Any Foot concerns (callous, ulcer, wound, thickened nails, toenail fungus, skin fungus, hammer toe): none Last dilated eye exam: due march 2019  Current treatments: Metformin 850 mg bid. Actos 30 mg once daily.  Medication compliance: good  Current diet: in general, a "healthy" diet   but states she ate unhealthy over the holidays and gained weights.  Current exercise: none  Known diabetic complications: none  The following portions of the patient's history were reviewed and  updated as appropriate: allergies, current medications, past medical history, past social history and problem list.  ROS as in subjective above.     Objective:    Physical Exam Alert and in no distress. No LE edema.   Blood pressure 120/80, pulse 80, weight 179 lb 6.4 oz (81.4 kg).  Lab Review Diabetic Labs Latest Ref Rng & Units 04/02/2017 12/02/2016 07/16/2016 03/13/2016 11/01/2015  HbA1c - 6.5% 8.1 7.7 7.8 7.2  Microalbumin mg/L - - - - 7.4  Micro/Creat Ratio - - - - - 7.4  Chol <200 mg/dL - 139 - - 92(L)  HDL >50 mg/dL - 61 - - 50  Calc LDL <130 mg/dL - - - - 32  Triglycerides <150 mg/dL - 61 - - 50  Creatinine 0.60 - 0.88 mg/dL - 0.91(H) - - 1.06(H)   BP/Weight 04/02/2017 12/02/2016 07/16/2016 03/13/2016 05/12/8117  Systolic BP 147 829 562 130 865  Diastolic BP 80 80 74 70 70  Wt. (Lbs) 179.4 174.2 170 167 167  BMI 30.79 29.9 29.18 28.67 28.67   Foot/eye exam completion dates Latest Ref Rng & Units 12/02/2016 04/21/2016  Eye Exam No Retinopathy - No Retinopathy  Foot exam Order - - -  Foot Form Completion - Done -    Ebonye  reports that  has never smoked. she has never used smokeless tobacco. She reports that she does not drink alcohol or use drugs.     Assessment & Plan:    Type 2 diabetes mellitus without  complication, without long-term current use of insulin ([place]) - Plan: pioglitazone (ACTOS) 15 MG tablet, HgB A1c  Hypertension associated with diabetes ([place])  Hyperlipidemia associated with type 2 diabetes mellitus ([place])  1. Rx changes: Blood sugars responded well to addition of Actos. Hgb A1c today is 6.5%. Since she feels like she is having fatigue we will cut back on Actos from 30 mg to 15 mg daily and continue on current dose of Metformin to see if this improves her symptoms.  2. Education: Reviewed 'ABCs' of diabetes management (respective goals in parentheses):  A1C (<7), blood pressure (<130/80), and cholesterol (LDL <100). 3. Compliance at present is estimated  to be good. Efforts to improve compliance (if necessary) will be directed at dietary modifications: cut back on sugar and carbohydrates in general, increased exercise and regular blood sugar monitoring: daily. 4. BP is at goal. Continue on current medications.  5. Seems to be doing fine on atorvastatin. She will continue.  6. Follow up: 4 months or sooner if fatigue is not improving with lower dose Actos.

## 2017-04-02 NOTE — Patient Instructions (Signed)
Cut back from Actos 30 mg to 15 mg daily. This new prescription was sent to the pharmacy.   Increase your physical activity. If you do not feel comfortable walking outside then consider joining the [facility].   Follow up with Dr. Redmond School in 4 months or sooner if you are not doing well on the Actos or if you are seeing any low blood sugars <90 or if your blood sugars start running higher.

## 2017-05-13 ENCOUNTER — Other Ambulatory Visit: Payer: Self-pay | Admitting: Family Medicine

## 2017-05-13 DIAGNOSIS — H52221 Regular astigmatism, right eye: Secondary | ICD-10-CM | POA: Diagnosis not present

## 2017-05-13 DIAGNOSIS — E119 Type 2 diabetes mellitus without complications: Secondary | ICD-10-CM

## 2017-05-13 DIAGNOSIS — H524 Presbyopia: Secondary | ICD-10-CM | POA: Diagnosis not present

## 2017-05-13 DIAGNOSIS — H5203 Hypermetropia, bilateral: Secondary | ICD-10-CM | POA: Diagnosis not present

## 2017-05-13 DIAGNOSIS — H25099 Other age-related incipient cataract, unspecified eye: Secondary | ICD-10-CM | POA: Diagnosis not present

## 2017-05-13 DIAGNOSIS — H18419 Arcus senilis, unspecified eye: Secondary | ICD-10-CM | POA: Diagnosis not present

## 2017-05-13 DIAGNOSIS — Z7984 Long term (current) use of oral hypoglycemic drugs: Secondary | ICD-10-CM | POA: Diagnosis not present

## 2017-05-13 LAB — HM DIABETES EYE EXAM

## 2017-05-14 DIAGNOSIS — Z1231 Encounter for screening mammogram for malignant neoplasm of breast: Secondary | ICD-10-CM | POA: Diagnosis not present

## 2017-05-18 ENCOUNTER — Encounter: Payer: Self-pay | Admitting: Family Medicine

## 2017-07-21 ENCOUNTER — Other Ambulatory Visit: Payer: Self-pay | Admitting: Family Medicine

## 2017-07-21 NOTE — Telephone Encounter (Signed)
[facility] advise . [place]

## 2017-08-03 ENCOUNTER — Encounter: Payer: Self-pay | Admitting: Family Medicine

## 2017-08-03 ENCOUNTER — Ambulatory Visit (INDEPENDENT_AMBULATORY_CARE_PROVIDER_SITE_OTHER): Payer: Medicare Other | Admitting: Family Medicine

## 2017-08-03 VITALS — BP 162/86 | HR 72 | Temp 97.9°F | Ht 63.5 in | Wt 182.0 lb

## 2017-08-03 DIAGNOSIS — E669 Obesity, unspecified: Secondary | ICD-10-CM | POA: Diagnosis not present

## 2017-08-03 DIAGNOSIS — E1169 Type 2 diabetes mellitus with other specified complication: Secondary | ICD-10-CM | POA: Diagnosis not present

## 2017-08-03 DIAGNOSIS — E119 Type 2 diabetes mellitus without complications: Secondary | ICD-10-CM

## 2017-08-03 DIAGNOSIS — J4530 Mild persistent asthma, uncomplicated: Secondary | ICD-10-CM

## 2017-08-03 DIAGNOSIS — I10 Essential (primary) hypertension: Secondary | ICD-10-CM

## 2017-08-03 DIAGNOSIS — E1159 Type 2 diabetes mellitus with other circulatory complications: Secondary | ICD-10-CM

## 2017-08-03 DIAGNOSIS — E785 Hyperlipidemia, unspecified: Secondary | ICD-10-CM

## 2017-08-03 LAB — POCT GLYCOSYLATED HEMOGLOBIN (HGB A1C): Hemoglobin A1C: 7 % — AB (ref 4.0–5.6)

## 2017-08-03 NOTE — Addendum Note (Signed)
Addended by: Elyse Jarvis on: 08/03/2017 12:42 PM   Modules accepted: Orders

## 2017-08-03 NOTE — Progress Notes (Signed)
  Subjective:    Patient ID: Linda Garcia, female    DOB: [birth year], 83 y.o.   MRN: [id number]  Linda Garcia is a 83 y.o. female who presents for follow-up of Type 2 diabetes mellitus.  Patient is checking home blood sugars.   Home blood sugar records: meter records  How often is blood sugars being checked: QD usually in the low 100s  current symptoms/problems include none and have been unchanged. Daily foot checks: yes   Any foot concerns: no Last eye exam: may 2019 Exercise: stretch She continues on Advair 100/50 however is using her rescue inhaler 4 or 5 times per week.  She has been doing this for the last several weeks.  She continues on Metformin and Actos and having no difficulty with that.  She also is taking lisinopril/HCTZ and atorvastatin.  She states that she has several containers of the Advair at the 100/50 range. The following portions of the patient's history were reviewed and updated as appropriate: allergies, current medications, past medical history, past social history and problem list.  ROS as in subjective above.     Objective:    Physical Exam Alert and in no distress otherwise not examined.    Lab Review Diabetic Labs Latest Ref Rng & Units 04/02/2017 12/02/2016 07/16/2016 03/13/2016 11/01/2015  HbA1c - 6.5% 8.1 7.7 7.8 7.2  Microalbumin mg/L - - - - 7.4  Micro/Creat Ratio - - - - - 7.4  Chol <200 mg/dL - 139 - - 92(L)  HDL >50 mg/dL - 61 - - 50  Calc LDL mg/dL (calc) - 64 - - 32  Triglycerides <150 mg/dL - 61 - - 50  Creatinine 0.60 - 0.88 mg/dL - 0.91(H) - - 1.06(H)   BP/Weight 04/02/2017 12/02/2016 07/16/2016 03/13/2016 7/62/8315  Systolic BP 176 160 737 106 269  Diastolic BP 80 80 74 70 70  Wt. (Lbs) 179.4 174.2 170 167 167  BMI 30.79 29.9 29.18 28.67 28.67   Foot/eye exam completion dates Latest Ref Rng & Units 05/13/2017 12/02/2016  Eye Exam No Retinopathy No Retinopathy -  Foot exam Order - - -  Foot Form Completion - - Done  A1c is  7.0  Linda Garcia  reports that she has never smoked. She has never used smokeless tobacco. She reports that she does not drink alcohol or use drugs.     Assessment & Plan:    Type 2 diabetes mellitus without complication, without long-term current use of insulin (HCC)  Hypertension associated with diabetes ([place])  Hyperlipidemia associated with type 2 diabetes mellitus ([place])  Mild persistent asthma without complication  Obesity (BMI 30-39.9)    1. Rx changes: none 2. Education: Reviewed 'ABCs' of diabetes management (respective goals in parentheses):  A1C (<7), blood pressure (<130/80), and cholesterol (LDL <100). 3. Compliance at present is estimated to be good. Efforts to improve compliance (if necessary) will be directed at increased exercise.  Recommended 20 minutes of something physical daily. 4. Follow up: 4 months I discussed increasing her Advair however she apparently has plenty of Advair at the 100/50 range.  Expressed to her the need to go higher if when she finishes the present Advair, she is still having difficulty with having to use the rescue inhaler for 5 times per week.

## 2017-08-03 NOTE — Patient Instructions (Signed)
When you are about to run out of the Advair, blood pressure blood sugar when are you checking them Occasionally check your blood sugars 2  hours after meals 20 minutes of something physical every day.  It can be to 10-minute walks

## 2017-09-11 ENCOUNTER — Other Ambulatory Visit: Payer: Self-pay | Admitting: Family Medicine

## 2017-09-11 DIAGNOSIS — E119 Type 2 diabetes mellitus without complications: Secondary | ICD-10-CM

## 2017-10-04 ENCOUNTER — Other Ambulatory Visit: Payer: Self-pay | Admitting: Family Medicine

## 2017-10-04 DIAGNOSIS — E785 Hyperlipidemia, unspecified: Principal | ICD-10-CM

## 2017-10-04 DIAGNOSIS — E1169 Type 2 diabetes mellitus with other specified complication: Secondary | ICD-10-CM

## 2017-10-29 ENCOUNTER — Ambulatory Visit (INDEPENDENT_AMBULATORY_CARE_PROVIDER_SITE_OTHER): Payer: Medicare Other | Admitting: Family Medicine

## 2017-10-29 ENCOUNTER — Encounter: Payer: Self-pay | Admitting: Family Medicine

## 2017-10-29 VITALS — BP 140/86 | HR 92 | Temp 99.8°F | Ht 63.5 in | Wt 176.2 lb

## 2017-10-29 DIAGNOSIS — J40 Bronchitis, not specified as acute or chronic: Secondary | ICD-10-CM

## 2017-10-29 DIAGNOSIS — E119 Type 2 diabetes mellitus without complications: Secondary | ICD-10-CM

## 2017-10-29 DIAGNOSIS — R05 Cough: Secondary | ICD-10-CM | POA: Diagnosis not present

## 2017-10-29 DIAGNOSIS — R059 Cough, unspecified: Secondary | ICD-10-CM

## 2017-10-29 DIAGNOSIS — J45909 Unspecified asthma, uncomplicated: Secondary | ICD-10-CM

## 2017-10-29 DIAGNOSIS — J329 Chronic sinusitis, unspecified: Secondary | ICD-10-CM

## 2017-10-29 MED ORDER — AMOXICILLIN 875 MG PO TABS: 875.0000 mg | ORAL_TABLET | Freq: Two times a day (BID) | ORAL | 0 refills | Status: DC

## 2017-10-29 MED ORDER — BENZONATATE 200 MG PO CAPS: 200.0000 mg | ORAL_CAPSULE | Freq: Three times a day (TID) | ORAL | 0 refills | Status: DC | PRN

## 2017-10-29 NOTE — Patient Instructions (Addendum)
  You have an infection likely in your sinuses, may also be in your bronchial tubes. Take the antibiotics twice daily for 10 days.  Call us on Monday if you are not any better or if you are worse. Today you had a low grade fever.  If you feel warm, please monitor for fevers at home, as that would be an indication that you are not getting better (if fevers don't resolve). Continue the Tussin. You may restart the loratidine. You may consider trying sinus rinses or using nasal saline. Be sure to stay well hydrated and drink plenty of water.  Try and sleep with the head of the bed elevated some, rather than flat.  You may use the benzonatate as needed for cough.  Your sugars should start to improve, as the infection is getting treated with the antibiotics.

## 2017-10-29 NOTE — Progress Notes (Signed)
Chief Complaint  Patient presents with  . Cough    and runny nose x 9 days. Mucus is light yellow. No fevers. Blood sugars all week have been around 200 fasting in the am and she has not had any appetite.     83 yo with HTN, DM, dyslipidemia and asthma presents with 12 days of upper respiratory symptoms.  She reports she started 9/14 coughing, head cold.  She feels like the "head cold is practically gone", cough had improved, but restarted 2 days ago. Denies tightness, wheezing or shortness of breath, doesn't feel like her asthma is flaring. She has noticed her sugars going up over the last week, as high as 200 a few days ago, despite decreased appetite, and this is concerning her.  Cough is productive of whitish phlegm, thick, productive all day long.  She has been taking diabetic Tussin (not DM) and cough drops. No other OTC medications.  No sick contacts recently (had been on bus tour prior to start of illness).  PMH, PSH, SH reviewed  No Known Allergies  Outpatient Encounter Medications as of 10/29/2017  Medication Sig Note  . aspirin 81 MG tablet Take 81 mg by mouth daily.     Marland Kitchen atorvastatin (LIPITOR) 10 MG tablet TAKE 1 TABLET DAILY   . dextromethorphan-guaiFENesin (TUSSIN DM MAX SUGAR-FREE) 10-100 MG/5ML liquid Take 5 mLs by mouth every 4 (four) hours as needed for cough.   . fexofenadine (ALLEGRA) 180 MG tablet Take 180 mg by mouth daily. 10/29/2017: Recently has been taking loratidine instead.  . fish oil-omega-3 fatty acids 1000 MG capsule Take 2 g by mouth daily.     . Fluticasone-Salmeterol (ADVAIR DISKUS) 100-50 MCG/DOSE AEPB USE 1 INHALATION EVERY 12 HOURS   . lisinopril-hydrochlorothiazide (PRINZIDE,ZESTORETIC) 20-12.5 MG tablet Take 1 tablet by mouth daily.   . metFORMIN (GLUCOPHAGE) 850 MG tablet TAKE 1 TABLET TWICE A DAY WITH MEALS   . Multiple Vitamins-Minerals (MULTIVITAMIN WITH MINERALS) tablet Take 1 tablet by mouth daily.     . pioglitazone (ACTOS) 15 MG tablet  TAKE 1 TABLET DAILY   . PROAIR HFA 108 (90 Base) MCG/ACT inhaler USE 2 INHALATIONS EVERY 6 HOURS AS NEEDED FOR WHEEZING OR SHORTNESS OF BREATH   . [DISCONTINUED] FLUAD 0.5 ML SUSY     Facility-Administered Encounter Medications as of 10/29/2017  Medication  . influenza  inactive virus vaccine (FLUZONE/FLUARIX) injection 0.5 mL   ROS: Denies sore throat, fever, chills, nausea, vomiting, diarrhea, urinary complaints (leakage with coughing). No bleeding, bruising, rash. Chest pain, shortness of breath, wheezing.   PHYSICAL EXAM:  BP 140/86   Pulse 92   Temp 99.8 F (37.7 C) (Tympanic)   Ht 5' 3.5" (1.613 m)   Wt 176 lb 3.2 oz (79.9 kg)   BMI 30.72 kg/m   Well appearing, pleasant female, slightly younger than stated age. She appears slightly tired.  Rare deep cough (only once). Speaking easily in no distress. Sounds nasal/congested HEENT:  Conjunctiva and sclera are clear, EOMI. TM's and EAC's are normal. Nasal mucosa is moderately edematous, mildly erythematous, thick white mucus noted L>R. Sinuses nontender. OP is clear Neck: no lymphadenopathy or mass Heart: regular rate and rhythm Lungs: initially had coarse BS and ronchi--cleared after coughing up moderate amount of thick, light green phlegm.  Lung exam was then clear--no wheezes, rales, ronchi. Extremities: no edema Skin: normal turgor Neuro: alert and oriented Psych: normal mood, affect, hygiene and grooming   ASSESSMENT/PLAN:  Sinobronchitis - Plan: amoxicillin (AMOXIL) 875 MG  tablet  Asthma in adult without complication, unspecified asthma severity, unspecified whether persistent  Type 2 diabetes mellitus without complication, without long-term current use of insulin (HCC)  Cough - Plan: benzonatate (TESSALON) 200 MG capsule   You have an infection likely in your sinuses, may also be in your bronchial tubes. Take the antibiotics twice daily for 10 days.  Call us on Monday if you are not any better or if you are  worse. Today you had a low grade fever.  If you feel warm, please monitor for fevers at home, as that would be an indication that you are not getting better (if fevers don't resolve). Continue the Tussin. You may restart the loratidine. You may consider trying sinus rinses or using nasal saline. Be sure to stay well hydrated and drink plenty of water.  Try and sleep with the head of the bed elevated some, rather than flat.  You may use the benzonatate as needed for cough.

## 2017-11-09 ENCOUNTER — Other Ambulatory Visit: Payer: Self-pay | Admitting: Family Medicine

## 2017-11-09 DIAGNOSIS — E119 Type 2 diabetes mellitus without complications: Secondary | ICD-10-CM

## 2017-11-16 ENCOUNTER — Telehealth: Payer: Self-pay | Admitting: Family Medicine

## 2017-11-16 MED ORDER — FLUTICASONE-SALMETEROL 100-50 MCG/DOSE IN AEPB: INHALATION_SPRAY | RESPIRATORY_TRACT | 1 refills | Status: DC

## 2017-11-16 NOTE — Telephone Encounter (Signed)
Pt called and left message it is time for [facility] refill. Pt ph [phone]

## 2017-11-18 ENCOUNTER — Telehealth: Payer: Self-pay

## 2017-11-18 NOTE — Telephone Encounter (Signed)
[facility] stating [facility] do not have Lisinopril/ Hydrochlorothiazide due to it being out of stock. New prescription needs to be sent. Please advise.

## 2017-11-18 NOTE — Telephone Encounter (Signed)
Call this into a local drugstore.  Letter know that it is very inexpensive

## 2017-11-20 NOTE — Telephone Encounter (Signed)
Pt advised and script was sent into [facility]. [place]

## 2017-12-01 ENCOUNTER — Telehealth: Payer: Self-pay

## 2017-12-01 NOTE — Telephone Encounter (Signed)
Called pt about screening for Fall and depression. LVM for pt to call back. [place]

## 2017-12-04 ENCOUNTER — Encounter: Payer: Self-pay | Admitting: Family Medicine

## 2017-12-04 ENCOUNTER — Other Ambulatory Visit: Payer: Self-pay

## 2017-12-04 ENCOUNTER — Ambulatory Visit (INDEPENDENT_AMBULATORY_CARE_PROVIDER_SITE_OTHER): Payer: Medicare Other | Admitting: Family Medicine

## 2017-12-04 VITALS — BP 124/82 | HR 70 | Temp 98.0°F | Wt 178.8 lb

## 2017-12-04 DIAGNOSIS — E785 Hyperlipidemia, unspecified: Secondary | ICD-10-CM

## 2017-12-04 DIAGNOSIS — I10 Essential (primary) hypertension: Secondary | ICD-10-CM | POA: Diagnosis not present

## 2017-12-04 DIAGNOSIS — I152 Hypertension secondary to endocrine disorders: Secondary | ICD-10-CM

## 2017-12-04 DIAGNOSIS — J302 Other seasonal allergic rhinitis: Secondary | ICD-10-CM

## 2017-12-04 DIAGNOSIS — E1169 Type 2 diabetes mellitus with other specified complication: Secondary | ICD-10-CM | POA: Diagnosis not present

## 2017-12-04 DIAGNOSIS — E669 Obesity, unspecified: Secondary | ICD-10-CM | POA: Diagnosis not present

## 2017-12-04 DIAGNOSIS — E1159 Type 2 diabetes mellitus with other circulatory complications: Secondary | ICD-10-CM

## 2017-12-04 DIAGNOSIS — J4530 Mild persistent asthma, uncomplicated: Secondary | ICD-10-CM

## 2017-12-04 DIAGNOSIS — E119 Type 2 diabetes mellitus without complications: Secondary | ICD-10-CM

## 2017-12-04 DIAGNOSIS — J45909 Unspecified asthma, uncomplicated: Secondary | ICD-10-CM

## 2017-12-04 LAB — POCT UA - MICROALBUMIN
Albumin/Creatinine Ratio, Urine, POC: 9.8
Creatinine, POC: 120.8 mg/dL
MICROALBUMIN (UR) POC: 11.8 mg/L

## 2017-12-04 LAB — POCT GLYCOSYLATED HEMOGLOBIN (HGB A1C): HEMOGLOBIN A1C: 7.2 % — AB (ref 4.0–5.6)

## 2017-12-04 MED ORDER — LISINOPRIL-HYDROCHLOROTHIAZIDE 20-12.5 MG PO TABS: 1.0000 | ORAL_TABLET | Freq: Every day | ORAL | 3 refills | Status: DC

## 2017-12-04 MED ORDER — FLUTICASONE-SALMETEROL 250-50 MCG/DOSE IN AEPB: 1.0000 | INHALATION_SPRAY | Freq: Two times a day (BID) | RESPIRATORY_TRACT | 3 refills | Status: DC

## 2017-12-04 MED ORDER — FLUTICASONE-SALMETEROL 250-50 MCG/DOSE IN AEPB: 3.0000 | INHALATION_SPRAY | Freq: Two times a day (BID) | RESPIRATORY_TRACT | 3 refills | Status: DC

## 2017-12-04 NOTE — Addendum Note (Signed)
Addended by: Denita Lung on: 12/04/2017 01:58 PM   Modules accepted: Orders

## 2017-12-04 NOTE — Progress Notes (Signed)
Subjective:    Patient ID: Linda Garcia, female    DOB: 1934-07-13, 83 y.o.   MRN: [id number]  Linda Garcia is a 83 y.o. female who presents for follow-up of Type 2 diabetes mellitus.  Patient is checking home blood sugars.  She notes that they have been running slightly higher due to her recent infection.  She does complain of some slight drainage as well as hoarse voice.  She has been using her albuterol 2 or 3 times per week at night.  She recently did get her series of shingles vaccines.  She continues on Metformin and Actos and having no difficulty with that.  Continues on atorvastatin without difficulty and also taking lisinopril/HCTZ. Home blood sugar records: no record How often is blood sugars being checked: QD am fasting 130-250( pt has been sick) Current symptoms/problems include none and have been unchanged. Daily foot checks: yes   Any foot concerns: no Last eye exam: 04/2017 Exercise: active but not involved in a regular exercise program.  The following portions of the patient's history were reviewed and updated as appropriate: allergies, current medications, past medical history, past social history and problem list.  ROS as in subjective above.     Objective:    Physical Exam Alert and in no distress foot exam is recorded and is normal   Lab Review Diabetic Labs Latest Ref Rng & Units 08/03/2017 04/02/2017 12/02/2016 07/16/2016 03/13/2016  HbA1c 4.0 - 5.6 % 7.0(A) 6.5% 8.1 7.7 7.8  Microalbumin mg/L - - - - -  Micro/Creat Ratio - - - - - -  Chol <200 mg/dL - - 139 - -  HDL >50 mg/dL - - 61 - -  Calc LDL mg/dL (calc) - - 64 - -  Triglycerides <150 mg/dL - - 61 - -  Creatinine 0.60 - 0.88 mg/dL - - 0.91(H) - -   BP/Weight 10/29/2017 08/03/2017 04/02/2017 12/02/2016 4/69/6295  Systolic BP 284 132 440 102 725  Diastolic BP 86 86 80 80 74  Wt. (Lbs) 176.2 182 179.4 174.2 170  BMI 30.72 31.73 30.79 29.9 29.18   Foot/eye exam completion dates Latest Ref Rng &  Units 05/13/2017 12/02/2016  Eye Exam No Retinopathy No Retinopathy -  Foot exam Order - - -  Foot Form Completion - - Done  A1c is 7.6  Linda Garcia  reports that she has never smoked. She has never used smokeless tobacco. She reports that she does not drink alcohol or use drugs.     Assessment & Plan:    Asthma in adult without complication, unspecified asthma severity, unspecified whether persistent - Plan: Fluticasone-Salmeterol (ADVAIR DISKUS) 250-50 MCG/DOSE AEPB  Type 2 diabetes mellitus without complication, without long-term current use of insulin (HCC) - Plan: CBC with Differential/Platelet, Comprehensive metabolic panel, Lipid panel, POCT UA - Microalbumin  Hypertension associated with diabetes ([place]) - Plan: CBC with Differential/Platelet, Comprehensive metabolic panel  Hyperlipidemia associated with type 2 diabetes mellitus ([place]) - Plan: Lipid panel  Obesity (BMI 30-39.9) - Plan: CBC with Differential/Platelet, Comprehensive metabolic panel, Lipid panel  Seasonal allergic rhinitis, unspecified trigger  Mild persistent asthma without complication   1. Rx changes: Increase Advair to 250/50 she recently received a 67-month refill on her Advair and will start the new strength when it runs out. 2. Education: Reviewed 'ABCs' of diabetes management (respective goals in parentheses):  A1C (<7), blood pressure (<130/80), and cholesterol (LDL <100). 3. Compliance at present is estimated to be good. Efforts to improve compliance (if necessary)  will be directed at increased exercise. 4. Follow up: 4 months

## 2017-12-05 LAB — COMPREHENSIVE METABOLIC PANEL
ALK PHOS: 88 IU/L (ref 39–117)
ALT: 15 IU/L (ref 0–32)
AST: 26 IU/L (ref 0–40)
Albumin/Globulin Ratio: 1.6 (ref 1.2–2.2)
Albumin: 4.2 g/dL (ref 3.5–4.7)
BILIRUBIN TOTAL: 0.3 mg/dL (ref 0.0–1.2)
BUN/Creatinine Ratio: 22 (ref 12–28)
BUN: 23 mg/dL (ref 8–27)
CHLORIDE: 103 mmol/L (ref 96–106)
CO2: 21 mmol/L (ref 20–29)
Calcium: 9.5 mg/dL (ref 8.7–10.3)
Creatinine, Ser: 1.06 mg/dL — ABNORMAL HIGH (ref 0.57–1.00)
GFR calc Af Amer: 57 mL/min/{1.73_m2} — ABNORMAL LOW (ref >59)
GFR calc non Af Amer: 49 mL/min/{1.73_m2} — ABNORMAL LOW (ref >59)
Globulin, Total: 2.6 g/dL (ref 1.5–4.5)
Glucose: 145 mg/dL — ABNORMAL HIGH (ref 65–99)
Potassium: 3.9 mmol/L (ref 3.5–5.2)
Sodium: 144 mmol/L (ref 134–144)
Total Protein: 6.8 g/dL (ref 6.0–8.5)

## 2017-12-05 LAB — CBC WITH DIFFERENTIAL/PLATELET
BASOS ABS: 0 10*3/uL (ref 0.0–0.2)
Basos: 1 %
EOS (ABSOLUTE): 0.2 10*3/uL (ref 0.0–0.4)
Eos: 4 %
Hematocrit: 36.1 % (ref 34.0–46.6)
Hemoglobin: 11.8 g/dL (ref 11.1–15.9)
Immature Grans (Abs): 0 10*3/uL (ref 0.0–0.1)
Immature Granulocytes: 1 %
LYMPHS ABS: 1.6 10*3/uL (ref 0.7–3.1)
LYMPHS: 26 %
MCH: 28.2 pg (ref 26.6–33.0)
MCHC: 32.7 g/dL (ref 31.5–35.7)
MCV: 86 fL (ref 79–97)
Monocytes Absolute: 0.7 10*3/uL (ref 0.1–0.9)
Monocytes: 11 %
NEUTROS ABS: 3.7 10*3/uL (ref 1.4–7.0)
Neutrophils: 57 %
PLATELETS: 182 10*3/uL (ref 150–450)
RBC: 4.18 x10E6/uL (ref 3.77–5.28)
RDW: 14.5 % (ref 12.3–15.4)
WBC: 6.2 10*3/uL (ref 3.4–10.8)

## 2017-12-05 LAB — LIPID PANEL
CHOLESTEROL TOTAL: 153 mg/dL (ref 100–199)
Chol/HDL Ratio: 2.5 ratio (ref 0.0–4.4)
HDL: 62 mg/dL (ref >39)
LDL Calculated: 76 mg/dL (ref 0–99)
Triglycerides: 74 mg/dL (ref 0–149)
VLDL CHOLESTEROL CAL: 15 mg/dL (ref 5–40)

## 2017-12-24 ENCOUNTER — Other Ambulatory Visit: Payer: Self-pay

## 2018-03-10 ENCOUNTER — Other Ambulatory Visit: Payer: Self-pay | Admitting: Family Medicine

## 2018-03-10 DIAGNOSIS — E119 Type 2 diabetes mellitus without complications: Secondary | ICD-10-CM

## 2018-04-06 ENCOUNTER — Encounter: Payer: Self-pay | Admitting: Family Medicine

## 2018-04-06 ENCOUNTER — Ambulatory Visit (INDEPENDENT_AMBULATORY_CARE_PROVIDER_SITE_OTHER): Payer: Medicare Other | Admitting: Family Medicine

## 2018-04-06 VITALS — BP 128/84 | HR 80 | Temp 97.8°F | Ht 64.0 in | Wt 181.4 lb

## 2018-04-06 DIAGNOSIS — E1159 Type 2 diabetes mellitus with other circulatory complications: Secondary | ICD-10-CM

## 2018-04-06 DIAGNOSIS — E119 Type 2 diabetes mellitus without complications: Secondary | ICD-10-CM

## 2018-04-06 DIAGNOSIS — E1169 Type 2 diabetes mellitus with other specified complication: Secondary | ICD-10-CM | POA: Diagnosis not present

## 2018-04-06 DIAGNOSIS — J4530 Mild persistent asthma, uncomplicated: Secondary | ICD-10-CM

## 2018-04-06 DIAGNOSIS — E785 Hyperlipidemia, unspecified: Secondary | ICD-10-CM | POA: Diagnosis not present

## 2018-04-06 DIAGNOSIS — J302 Other seasonal allergic rhinitis: Secondary | ICD-10-CM | POA: Diagnosis not present

## 2018-04-06 DIAGNOSIS — E669 Obesity, unspecified: Secondary | ICD-10-CM | POA: Diagnosis not present

## 2018-04-06 DIAGNOSIS — I10 Essential (primary) hypertension: Secondary | ICD-10-CM

## 2018-04-06 LAB — POCT GLYCOSYLATED HEMOGLOBIN (HGB A1C): HEMOGLOBIN A1C: 7.6 % — AB (ref 4.0–5.6)

## 2018-04-06 MED ORDER — ALBUTEROL SULFATE HFA 108 (90 BASE) MCG/ACT IN AERS: INHALATION_SPRAY | RESPIRATORY_TRACT | 1 refills | Status: DC

## 2018-04-06 NOTE — Progress Notes (Signed)
Linda Alexanders, MD   04/06/2018  Linda Garcia is a 84 y.o. female who presents for annual wellness visit and follow-up on chronic medical conditions.  She does have asthma and is on Advair and does occasionally use albuterol but usually several times per month.  Her allergies are under good control.  She also has diabetes and is taking Actos as well as metformin and having no difficulty with that.  She is stable on atorvastatin as well as lisinopril/HCTZ.  She is active but is not involved in a regular exercise program.  She is now retired and enjoying her retirement.  Her immunizations are up-to-date including Shingrix.  Immunizations and Health Maintenance Immunization History  Administered Date(s) Administered  . Influenza Split 11/01/2010, 10/27/2011  . Influenza Whole 11/14/2009  . Influenza, High Dose Seasonal PF 11/16/2013, 10/02/2014, 11/01/2015, 12/02/2016  . Influenza,inj,Quad PF,6+ Mos 10/27/2012  . Pneumococcal Conjugate-13 07/16/2016  . Pneumococcal Polysaccharide-23 02/04/2008, 07/15/2013  . Td 06/11/2005  . Tdap 11/09/2015  . Zoster 08/03/2012   Health Maintenance Due  Topic Date Due  . INFLUENZA VACCINE  09/03/2017    Last Pap smear: aged out Last mammogram:05/2017 Last colonoscopy:aged out Last DEXA:11/26/2007 Dentist: over one year Ophtho:05/13/17 Exercise: walking  Other doctors caring for patient include:  No other doctors at this time  Advanced directives: Does Patient Have a Medical Advance Directive?: No Would patient like information on creating a medical advance directive?: Yes (MAU/Ambulatory/Procedural Areas - Information given)  Depression screen:  See questionnaire below.  Depression screen [facility] 2/9 04/06/2018 12/01/2017 12/02/2016 07/16/2016 03/13/2016  Decreased Interest 0 0 0 0 0  Down, Depressed, Hopeless 0 0 0 0 0  PHQ - 2 Score 0 0 0 0 0    Fall Risk Screen: see questionnaire below. Fall Risk  04/06/2018 12/24/2017 12/01/2017 12/02/2016  07/16/2016  Falls in the past year? 0 0 No No No  Comment - Emmi Telephone Survey: data to providers prior to load - - -    ADL screen:  See questionnaire below Functional Status Survey: Is the patient deaf or have difficulty hearing?: No Does the patient have difficulty seeing, even when wearing glasses/contacts?: No Does the patient have difficulty concentrating, remembering, or making decisions?: No Does the patient have difficulty walking or climbing stairs?: No Does the patient have difficulty dressing or bathing?: No Does the patient have difficulty doing errands alone such as visiting a doctor's office or shopping?: No   Review of Systems Constitutional: -, -unexpected weight change, -anorexia, -fatigue Allergy: -sneezing, -itching, -congestion Dermatology: denies changing moles, rash, lumps ENT: -runny nose, -ear pain, -sore throat,  Cardiology:  -chest pain, -palpitations, -orthopnea, Respiratory: -cough, -shortness of breath, -dyspnea on exertion, -wheezing,  Gastroenterology: -abdominal pain, -nausea, -vomiting, -diarrhea, -constipation, -dysphagia Hematology: -bleeding or bruising problems Musculoskeletal: -arthralgias, -myalgias, -joint swelling, -back pain, - Ophthalmology: -vision changes,  Urology: -dysuria, -difficulty urinating,  -urinary frequency, -urgency, incontinence Neurology: -, -numbness, , -memory loss, -falls, -dizziness    PHYSICAL EXAM:  BP 128/84 (BP Location: Left Arm, Patient Position: Sitting)   Pulse 80   Temp 97.8 F (36.6 C)   Ht 5\' 4"  (1.626 m)   Wt 181 lb 6.4 oz (82.3 kg)   SpO2 99%   BMI 31.14 kg/m   General Appearance: Alert, cooperative, no distress, appears stated age Head: Normocephalic, without obvious abnormality, atraumatic Eyes: PERRL, conjunctiva/corneas clear, EOM's intact, fundi benign Ears: Normal TM's and external ear canals Nose: Nares normal, mucosa normal, no drainage or sinus tenderness  Throat: Lips, mucosa, and  tongue normal; teeth and gums normal Neck: Supple, no lymphadenopathy;  thyroid:  no enlargement/tenderness/nodules; no carotid bruit or JVD Lungs: Clear to auscultation bilaterally without wheezes, rales or ronchi; respirations unlabored Heart: Regular rate and rhythm, S1 and S2 normal, no murmur, rubor gallop Abdomen: Soft, non-tender, nondistended, normoactive bowel sounds,  no masses, no hepatosplenomegaly Extremities: No clubbing, cyanosis or edema Pulses: 2+ and symmetric all extremities Skin:  Skin color, texture, turgor normal, no rashes or lesions Lymph nodes: Cervical, supraclavicular, and axillary nodes normal Neurologic:  CNII-XII intact, normal strength, sensation and gait; reflexes 2+ and symmetric throughout Psych: Normal mood, affect, hygiene and grooming. Hemoglobin A1c is 7.6  ASSESSMENT/PLAN: Type 2 diabetes mellitus without complication, without long-term current use of insulin (HCC) - Plan: POCT glycosylated hemoglobin (Hb A1C)  Mild persistent asthma without complication - Plan: albuterol (PROAIR HFA) 108 (90 Base) MCG/ACT inhaler  Seasonal allergic rhinitis, unspecified trigger  Hyperlipidemia associated with type 2 diabetes mellitus ([place])  Hypertension associated with diabetes (HCC)  Obesity (BMI 30-39.9)    at least 30 minutes of aerobic activity at least 5 days/week and weight-bearing exercise 2x/week;  healthy diet, including goals of calcium and vitamin D intake and .  Immunization recommendations discussed.   Medicare Attestation I have personally reviewed: The patient's medical and social history Their use of alcohol, tobacco or illicit drugs Their current medications and supplements The patient's functional ability including ADLs,fall risks, home safety risks, cognitive, and hearing and visual impairment Diet and physical activities Evidence for depression or mood disorders  The patient's weight, height, and BMI have been recorded in the chart.  I  have made referrals, counseling, and provided education to the patient based on review of the above and I have provided the patient with a written personalized care plan for preventive services.     Linda Alexanders, MD   04/06/2018

## 2018-04-19 ENCOUNTER — Telehealth: Payer: Self-pay | Admitting: Family Medicine

## 2018-04-19 NOTE — Telephone Encounter (Signed)
Pt called and she needs diabetic supplies sent to new company having issues with who she got them from before.  Wants new meter and supplies sent to [facility].  She had [employer] code meter and strips before but not sure if that will still be covered and she is fine if she has to switch to covered meter.

## 2018-04-20 NOTE — Telephone Encounter (Signed)
Advised pt to call her insurance to find out what her preferred meter will be . LVM and awaiting a call back. [place]

## 2018-05-04 ENCOUNTER — Other Ambulatory Visit: Payer: Self-pay | Admitting: Family Medicine

## 2018-05-04 DIAGNOSIS — E1159 Type 2 diabetes mellitus with other circulatory complications: Secondary | ICD-10-CM

## 2018-05-04 DIAGNOSIS — I152 Hypertension secondary to endocrine disorders: Secondary | ICD-10-CM

## 2018-05-04 DIAGNOSIS — I10 Essential (primary) hypertension: Principal | ICD-10-CM

## 2018-05-07 ENCOUNTER — Other Ambulatory Visit: Payer: Self-pay

## 2018-05-07 MED ORDER — GLUCOSE BLOOD VI STRP: ORAL_STRIP | 12 refills | Status: DC

## 2018-05-07 MED ORDER — ONETOUCH DELICA LANCETS 30G MISC: 1.0000 | 11 refills | Status: AC

## 2018-05-07 MED ORDER — ONETOUCH VERIO IQ SYSTEM W/DEVICE KIT: 1.0000 | PACK | Freq: Once | 0 refills | Status: AC

## 2018-05-07 NOTE — Telephone Encounter (Signed)
Done KH 

## 2018-05-10 ENCOUNTER — Other Ambulatory Visit: Payer: Self-pay

## 2018-05-10 MED ORDER — FREESTYLE LANCETS MISC: 12 refills | Status: AC

## 2018-05-10 MED ORDER — GLUCOSE BLOOD VI STRP: ORAL_STRIP | 12 refills | Status: AC

## 2018-05-10 MED ORDER — FREESTYLE LITE DEVI: 1.0000 | Freq: Once | 0 refills | Status: AC

## 2018-05-10 NOTE — Telephone Encounter (Signed)
Recv'd prior authorization request for One touch stating Freestyle Lite and Precision Xtra are preferred.  Called pt and she is fine with switching to either one that is covered.  Please switch pt to covered meter and supplies and send to [facility].  Thanks

## 2018-07-06 ENCOUNTER — Other Ambulatory Visit: Payer: Self-pay | Admitting: Family Medicine

## 2018-07-06 DIAGNOSIS — E119 Type 2 diabetes mellitus without complications: Secondary | ICD-10-CM

## 2018-07-28 ENCOUNTER — Telehealth: Payer: Self-pay | Admitting: Family Medicine

## 2018-07-28 NOTE — Telephone Encounter (Signed)
Pt called and states she received a coupon from her insurance company for a new meter. She would like a rx sent in for Freestyle Light to [facility] on [address]. Pt can be reached at [phone]

## 2018-07-29 ENCOUNTER — Other Ambulatory Visit: Payer: Self-pay

## 2018-07-29 MED ORDER — FREESTYLE LITE DEVI: 1.0000 | Freq: Once | 0 refills | Status: AC

## 2018-07-29 NOTE — Telephone Encounter (Signed)
Spoke to pt and Pharmacy and advised pt that script was sent into [facility] on her behalf. [place]

## 2018-08-10 ENCOUNTER — Other Ambulatory Visit: Payer: Self-pay

## 2018-08-10 ENCOUNTER — Ambulatory Visit (INDEPENDENT_AMBULATORY_CARE_PROVIDER_SITE_OTHER): Payer: Medicare Other | Admitting: Family Medicine

## 2018-08-10 ENCOUNTER — Encounter: Payer: Self-pay | Admitting: Family Medicine

## 2018-08-10 VITALS — BP 118/74 | HR 82 | Temp 97.6°F | Wt 180.4 lb

## 2018-08-10 DIAGNOSIS — E669 Obesity, unspecified: Secondary | ICD-10-CM | POA: Diagnosis not present

## 2018-08-10 DIAGNOSIS — I152 Hypertension secondary to endocrine disorders: Secondary | ICD-10-CM

## 2018-08-10 DIAGNOSIS — J4530 Mild persistent asthma, uncomplicated: Secondary | ICD-10-CM | POA: Diagnosis not present

## 2018-08-10 DIAGNOSIS — J302 Other seasonal allergic rhinitis: Secondary | ICD-10-CM

## 2018-08-10 DIAGNOSIS — E119 Type 2 diabetes mellitus without complications: Secondary | ICD-10-CM | POA: Diagnosis not present

## 2018-08-10 DIAGNOSIS — E785 Hyperlipidemia, unspecified: Secondary | ICD-10-CM

## 2018-08-10 DIAGNOSIS — E1169 Type 2 diabetes mellitus with other specified complication: Secondary | ICD-10-CM

## 2018-08-10 DIAGNOSIS — I10 Essential (primary) hypertension: Secondary | ICD-10-CM | POA: Diagnosis not present

## 2018-08-10 DIAGNOSIS — E1159 Type 2 diabetes mellitus with other circulatory complications: Secondary | ICD-10-CM | POA: Diagnosis not present

## 2018-08-10 LAB — POCT GLYCOSYLATED HEMOGLOBIN (HGB A1C): Hemoglobin A1C: 7 % — AB (ref 4.0–5.6)

## 2018-08-10 NOTE — Progress Notes (Signed)
  Subjective:    Patient ID: Linda Garcia, female    DOB: 07-04-1934, 84 y.o.   MRN: [id number]  Linda Garcia is a 84 y.o. female who presents for follow-up of Type 2 diabetes mellitus.  Patient is checking home blood sugars.   Home blood sugar records: no record as of today ( new meter) How often is blood sugars being checked: QD fasting 125-145 Current symptoms/problems include none at this time. Daily foot checks: yes   Any foot concerns: none Last eye exam: 2019 Exercise: glider ,walking.  She uses a glider for 5 minutes 3 times per day. She continues on p.o. glitazone as well as metformin and is having no difficulty with that.  Also taking atorvastatin and lisinopril/HCTZ. The following portions of the patient's history were reviewed and updated as appropriate: allergies, current medications, past medical history, past social history and problem list.  ROS as in subjective above.     Objective:    Physical Exam Alert and in no distress otherwise not examined.  Hemoglobin A1c is 7.0  Lab Review Diabetic Labs Latest Ref Rng & Units 04/06/2018 12/04/2017 08/03/2017 04/02/2017 12/02/2016  HbA1c 4.0 - 5.6 % 7.6(A) 7.2(A) 7.0(A) 6.5% 8.1  Microalbumin mg/L - 11.8 - - -  Micro/Creat Ratio - - 9.8 - - -  Chol 100 - 199 mg/dL - 153 - - 139  HDL >39 mg/dL - 62 - - 61  Calc LDL 0 - 99 mg/dL - 76 - - 64  Triglycerides 0 - 149 mg/dL - 74 - - 61  Creatinine 0.57 - 1.00 mg/dL - 1.06(H) - - 0.91(H)   BP/Weight 04/06/2018 12/04/2017 10/29/2017 08/03/2017 3/33/8329  Systolic BP 191 660 600 459 977  Diastolic BP 84 82 86 86 80  Wt. (Lbs) 181.4 178.8 176.2 182 179.4  BMI 31.14 31.18 30.72 31.73 30.79   Foot/eye exam completion dates Latest Ref Rng & Units 12/04/2017 05/13/2017  Eye Exam No Retinopathy - No Retinopathy  Foot exam Order - - -  Foot Form Completion - Done -    Linda Garcia  reports that she has never smoked. She has never used smokeless tobacco. She reports that she does  not drink alcohol or use drugs.     Assessment & Plan:    Encounter Diagnoses  Name Primary?  . Type 2 diabetes mellitus without complication, without long-term current use of insulin ([place]) Yes  . Seasonal allergic rhinitis, unspecified trigger   . Mild persistent asthma without complication   . Hyperlipidemia associated with type 2 diabetes mellitus ([place])   . Hypertension associated with diabetes ([place])   . Obesity (BMI 30-39.9)     1. Rx changes: none 2. Education: Reviewed 'ABCs' of diabetes management (respective goals in parentheses):  A1C (<7), blood pressure (<130/80), and cholesterol (LDL <100). 3. Compliance at present is estimated to be good. Efforts to improve compliance (if necessary) will be directed at increased exercise. 4. Follow up: 4 months Overall she seems to be doing fairly well though I did gently recommend she increase her physical activity.  Return here in the fall.

## 2018-08-24 DIAGNOSIS — E119 Type 2 diabetes mellitus without complications: Secondary | ICD-10-CM | POA: Diagnosis not present

## 2018-08-24 DIAGNOSIS — H18419 Arcus senilis, unspecified eye: Secondary | ICD-10-CM | POA: Diagnosis not present

## 2018-08-24 DIAGNOSIS — H25099 Other age-related incipient cataract, unspecified eye: Secondary | ICD-10-CM | POA: Diagnosis not present

## 2018-08-24 DIAGNOSIS — H524 Presbyopia: Secondary | ICD-10-CM | POA: Diagnosis not present

## 2018-08-24 DIAGNOSIS — H5203 Hypermetropia, bilateral: Secondary | ICD-10-CM | POA: Diagnosis not present

## 2018-08-24 DIAGNOSIS — H52221 Regular astigmatism, right eye: Secondary | ICD-10-CM | POA: Diagnosis not present

## 2018-08-24 DIAGNOSIS — Z7984 Long term (current) use of oral hypoglycemic drugs: Secondary | ICD-10-CM | POA: Diagnosis not present

## 2018-08-24 LAB — HM DIABETES EYE EXAM

## 2018-08-24 LAB — HEMOGLOBIN A1C: Hemoglobin A1C: 7

## 2018-10-27 ENCOUNTER — Other Ambulatory Visit: Payer: Medicare Other

## 2018-10-27 ENCOUNTER — Other Ambulatory Visit: Payer: Self-pay

## 2018-10-29 ENCOUNTER — Other Ambulatory Visit: Payer: Self-pay

## 2018-10-29 ENCOUNTER — Other Ambulatory Visit (INDEPENDENT_AMBULATORY_CARE_PROVIDER_SITE_OTHER): Payer: Medicare Other

## 2018-10-29 DIAGNOSIS — Z23 Encounter for immunization: Secondary | ICD-10-CM | POA: Diagnosis not present

## 2018-11-11 ENCOUNTER — Other Ambulatory Visit: Payer: Self-pay | Admitting: Family Medicine

## 2018-11-11 DIAGNOSIS — J45909 Unspecified asthma, uncomplicated: Secondary | ICD-10-CM

## 2018-11-11 NOTE — Telephone Encounter (Signed)
[facility] to [facility] pt [facility]. Please advise [facility]

## 2018-12-16 ENCOUNTER — Other Ambulatory Visit: Payer: Self-pay

## 2018-12-16 ENCOUNTER — Encounter: Payer: Self-pay | Admitting: Family Medicine

## 2018-12-16 ENCOUNTER — Ambulatory Visit (INDEPENDENT_AMBULATORY_CARE_PROVIDER_SITE_OTHER): Payer: Medicare Other | Admitting: Family Medicine

## 2018-12-16 VITALS — BP 132/86 | HR 100 | Temp 97.6°F | Wt 183.6 lb

## 2018-12-16 DIAGNOSIS — E669 Obesity, unspecified: Secondary | ICD-10-CM

## 2018-12-16 DIAGNOSIS — E785 Hyperlipidemia, unspecified: Secondary | ICD-10-CM

## 2018-12-16 DIAGNOSIS — J4530 Mild persistent asthma, uncomplicated: Secondary | ICD-10-CM | POA: Diagnosis not present

## 2018-12-16 DIAGNOSIS — E119 Type 2 diabetes mellitus without complications: Secondary | ICD-10-CM | POA: Diagnosis not present

## 2018-12-16 DIAGNOSIS — I10 Essential (primary) hypertension: Secondary | ICD-10-CM

## 2018-12-16 DIAGNOSIS — E1159 Type 2 diabetes mellitus with other circulatory complications: Secondary | ICD-10-CM

## 2018-12-16 DIAGNOSIS — E1169 Type 2 diabetes mellitus with other specified complication: Secondary | ICD-10-CM | POA: Diagnosis not present

## 2018-12-16 LAB — COMPREHENSIVE METABOLIC PANEL
ALT: 15 IU/L (ref 0–32)
AST: 20 IU/L (ref 0–40)
Albumin/Globulin Ratio: 1.8 (ref 1.2–2.2)
Albumin: 4.2 g/dL (ref 3.6–4.6)
Alkaline Phosphatase: 92 IU/L (ref 39–117)
BUN/Creatinine Ratio: 21 (ref 12–28)
BUN: 24 mg/dL (ref 8–27)
Bilirubin Total: 0.2 mg/dL (ref 0.0–1.2)
CO2: 21 mmol/L (ref 20–29)
Calcium: 9.2 mg/dL (ref 8.7–10.3)
Chloride: 102 mmol/L (ref 96–106)
Creatinine, Ser: 1.12 mg/dL — ABNORMAL HIGH (ref 0.57–1.00)
GFR calc Af Amer: 53 mL/min/{1.73_m2} — ABNORMAL LOW (ref >59)
GFR calc non Af Amer: 46 mL/min/{1.73_m2} — ABNORMAL LOW (ref >59)
Globulin, Total: 2.4 g/dL (ref 1.5–4.5)
Glucose: 138 mg/dL — ABNORMAL HIGH (ref 65–99)
Potassium: 3.7 mmol/L (ref 3.5–5.2)
Sodium: 139 mmol/L (ref 134–144)
Total Protein: 6.6 g/dL (ref 6.0–8.5)

## 2018-12-16 LAB — CBC WITH DIFFERENTIAL/PLATELET
Basophils Absolute: 0 10*3/uL (ref 0.0–0.2)
Basos: 0 %
EOS (ABSOLUTE): 0.3 10*3/uL (ref 0.0–0.4)
Eos: 5 %
Hematocrit: 33.6 % — ABNORMAL LOW (ref 34.0–46.6)
Hemoglobin: 11.3 g/dL (ref 11.1–15.9)
Immature Grans (Abs): 0 10*3/uL (ref 0.0–0.1)
Immature Granulocytes: 0 %
Lymphocytes Absolute: 1.1 10*3/uL (ref 0.7–3.1)
Lymphs: 21 %
MCH: 28.5 pg (ref 26.6–33.0)
MCHC: 33.6 g/dL (ref 31.5–35.7)
MCV: 85 fL (ref 79–97)
Monocytes Absolute: 0.5 10*3/uL (ref 0.1–0.9)
Monocytes: 9 %
Neutrophils Absolute: 3.4 10*3/uL (ref 1.4–7.0)
Neutrophils: 65 %
Platelets: 172 10*3/uL (ref 150–450)
RBC: 3.97 x10E6/uL (ref 3.77–5.28)
RDW: 13.7 % (ref 11.7–15.4)
WBC: 5.3 10*3/uL (ref 3.4–10.8)

## 2018-12-16 LAB — LIPID PANEL
Chol/HDL Ratio: 2.4 ratio (ref 0.0–4.4)
Cholesterol, Total: 133 mg/dL (ref 100–199)
HDL: 55 mg/dL (ref >39)
LDL Chol Calc (NIH): 67 mg/dL (ref 0–99)
Triglycerides: 51 mg/dL (ref 0–149)
VLDL Cholesterol Cal: 11 mg/dL (ref 5–40)

## 2018-12-16 LAB — POCT GLYCOSYLATED HEMOGLOBIN (HGB A1C): Hemoglobin A1C: 7.3 % — AB (ref 4.0–5.6)

## 2018-12-16 LAB — POCT UA - MICROALBUMIN
Albumin/Creatinine Ratio, Urine, POC: 6.3
Creatinine, POC: 110.4 mg/dL
Microalbumin Ur, POC: 6.9 mg/L

## 2018-12-16 MED ORDER — ATORVASTATIN CALCIUM 10 MG PO TABS: 10.0000 mg | ORAL_TABLET | Freq: Every day | ORAL | 3 refills | Status: DC

## 2018-12-16 MED ORDER — METFORMIN HCL 850 MG PO TABS: 850.0000 mg | ORAL_TABLET | Freq: Two times a day (BID) | ORAL | 1 refills | Status: DC

## 2018-12-16 NOTE — Progress Notes (Signed)
Subjective:    Patient ID: Linda Garcia, female    DOB: 1934-11-04, 84 y.o.   MRN: [id number]  Linda Garcia is a 84 y.o. female who presents for follow-up of Type 2 diabetes mellitus.  Home blood sugar records: fasting range: 93 to 146 Current symptoms/problems include none and have been unchanged. Daily foot checks:   Any foot concerns: no Exercise: The patient does not participate in regular exercise at present. Diet: Regular  She continues on metformin and Actos and having no difficulties with that.  She does occasionally complain of dizziness but states her blood sugar usually is around 100 with that occurs.  She is not any more specific than that.  She continues on lisinopril/HCTZ without trouble.  She continues on atorvastatin and having no aches or pains.  She does take her allergy medications and her asthma medication.  She uses her albuterol less than twice per week.  She does note the physical activity causes her to wheeze. the following portions of the patient's history were reviewed and updated as appropriate: allergies, current medications, past medical history, past social history and problem list.  ROS as in subjective above.     Objective:    Physical Exam Alert and in no distress foot exam is normal Hemoglobin A1c is 7.3 Lab Review Diabetic Labs Latest Ref Rng & Units 12/16/2018 08/24/2018 08/10/2018 04/06/2018 12/04/2017  HbA1c 4.0 - 5.6 % 7.3(A) 7.0 7.0(A) 7.6(A) 7.2(A)  Microalbumin mg/L 6.9 - - - 11.8  Micro/Creat Ratio - 6.3 - - - 9.8  Chol 100 - 199 mg/dL - - - - 153  HDL >39 mg/dL - - - - 62  Calc LDL 0 - 99 mg/dL - - - - 76  Triglycerides 0 - 149 mg/dL - - - - 74  Creatinine 0.57 - 1.00 mg/dL - - - - 1.06(H)   BP/Weight 12/16/2018 08/10/2018 04/06/2018 12/04/2017 [id number]  Systolic BP [id number]  Diastolic BP 86 74 84 82 86  Wt. (Lbs) 183.6 180.4 181.4 178.8 176.2  BMI 31.51 30.97 31.14 31.18 30.72   Foot/eye exam completion dates Latest  Ref Rng & Units 12/16/2018 08/24/2018  Eye Exam No Retinopathy - No Retinopathy  Foot exam Order - - -  Foot Form Completion - Done -    Harryette  reports that she has never smoked. She has never used smokeless tobacco. She reports that she does not drink alcohol or use drugs.     Assessment & Plan:    Type 2 diabetes mellitus without complication, without long-term current use of insulin (HCC) - Plan: CBC with Differential, Comprehensive metabolic panel, Lipid panel, POCT UA - Microalbumin, metFORMIN (GLUCOPHAGE) 850 MG tablet, HgB A1c  Mild persistent asthma without complication  Hyperlipidemia associated with type 2 diabetes mellitus (HCC) - Plan: Lipid panel, atorvastatin (LIPITOR) 10 MG tablet  Hypertension associated with diabetes ([place]) - Plan: CBC with Differential, Comprehensive metabolic panel  Obesity (BMI 30-39.9) - Plan: CBC with Differential, Comprehensive metabolic panel, Lipid panel   1. Rx changes: none 2. Education: Reviewed 'ABCs' of diabetes management (respective goals in parentheses):  A1C (<7), blood pressure (<130/80), and cholesterol (LDL <100). 3. Compliance at present is estimated to be fair. Efforts to improve compliance (if necessary) will be directed at increased exercise. 4. Follow up: 4 months Encouraged her to become more physically active and if she needs to use her inhaler more often for physical activity, that would be okay. Discussed the dizziness and  since it is intermittent in nature recommend she pay attention to it and see if there is more of a pattern.

## 2019-02-24 DIAGNOSIS — E119 Type 2 diabetes mellitus without complications: Secondary | ICD-10-CM | POA: Diagnosis not present

## 2019-04-11 ENCOUNTER — Other Ambulatory Visit: Payer: Self-pay | Admitting: Family Medicine

## 2019-04-11 DIAGNOSIS — E1159 Type 2 diabetes mellitus with other circulatory complications: Secondary | ICD-10-CM

## 2019-04-11 DIAGNOSIS — I152 Hypertension secondary to endocrine disorders: Secondary | ICD-10-CM

## 2019-04-19 ENCOUNTER — Encounter: Payer: Self-pay | Admitting: Family Medicine

## 2019-04-19 ENCOUNTER — Other Ambulatory Visit: Payer: Self-pay

## 2019-04-19 ENCOUNTER — Ambulatory Visit (INDEPENDENT_AMBULATORY_CARE_PROVIDER_SITE_OTHER): Payer: Medicare HMO | Admitting: Family Medicine

## 2019-04-19 VITALS — BP 132/80 | HR 91 | Temp 96.2°F | Wt 187.8 lb

## 2019-04-19 DIAGNOSIS — I10 Essential (primary) hypertension: Secondary | ICD-10-CM | POA: Diagnosis not present

## 2019-04-19 DIAGNOSIS — J302 Other seasonal allergic rhinitis: Secondary | ICD-10-CM | POA: Diagnosis not present

## 2019-04-19 DIAGNOSIS — E1159 Type 2 diabetes mellitus with other circulatory complications: Secondary | ICD-10-CM

## 2019-04-19 DIAGNOSIS — E119 Type 2 diabetes mellitus without complications: Secondary | ICD-10-CM | POA: Diagnosis not present

## 2019-04-19 DIAGNOSIS — E785 Hyperlipidemia, unspecified: Secondary | ICD-10-CM

## 2019-04-19 DIAGNOSIS — E669 Obesity, unspecified: Secondary | ICD-10-CM | POA: Diagnosis not present

## 2019-04-19 DIAGNOSIS — E1169 Type 2 diabetes mellitus with other specified complication: Secondary | ICD-10-CM

## 2019-04-19 DIAGNOSIS — J4530 Mild persistent asthma, uncomplicated: Secondary | ICD-10-CM | POA: Diagnosis not present

## 2019-04-19 LAB — POCT GLYCOSYLATED HEMOGLOBIN (HGB A1C): Hemoglobin A1C: 7.3 % — AB (ref 4.0–5.6)

## 2019-04-19 NOTE — Progress Notes (Signed)
  Subjective:    Patient ID: Linda Garcia, female    DOB: Oct 23, 1934, 85 y.o.   MRN: [id number]  Linda Garcia is a 85 y.o. female who presents for follow-up of Type 2 diabetes mellitus.  Home blood sugar records: meter records 104-150 fasting Current symptoms/problems include none at this time. Daily foot checks: yes   Any foot concerns: none Exercise: not every day Diet: regular She continues on Metformin and Actos and having no difficulty with that.  She is also taking lisinopril/HCTZ with no complaints.  Her allergies seem to be under good control and she continues on her asthma medication usually using the rescue inhaler less than twice per week. The following portions of the patient's history were reviewed and updated as appropriate: allergies, current medications, past medical history, past social history and problem list.  ROS as in subjective above.     Objective:    Physical Exam Alert and in no distress otherwise not examined. Hemoglobin A1c is 7.3  Lab Review Diabetic Labs Latest Ref Rng & Units 04/19/2019 12/16/2018 08/24/2018 08/10/2018 04/06/2018  HbA1c 4.0 - 5.6 % 7.3(A) 7.3(A) 7.0 7.0(A) 7.6(A)  Microalbumin mg/L - 6.9 - - -  Micro/Creat Ratio - - 6.3 - - -  Chol 100 - 199 mg/dL - 133 - - -  HDL >39 mg/dL - 55 - - -  Calc LDL 0 - 99 mg/dL - 67 - - -  Triglycerides 0 - 149 mg/dL - 51 - - -  Creatinine 0.57 - 1.00 mg/dL - 1.12(H) - - -   BP/Weight 04/19/2019 12/16/2018 08/10/2018 04/06/2018 [id number]  Systolic BP [id number] [id number] 0000000 [id number]  Diastolic BP 80 86 74 84 82  Wt. (Lbs) 187.8 183.6 180.4 181.4 178.8  BMI 32.24 31.51 30.97 31.14 31.18   Foot/eye exam completion dates Latest Ref Rng & Units 12/16/2018 08/24/2018  Eye Exam No Retinopathy - No Retinopathy  Foot exam Order - - -  Foot Form Completion - Done -    Linda Garcia  reports that she has never smoked. She has never used smokeless tobacco. She reports that she does not drink alcohol or use drugs.       Assessment & Plan:    Type 2 diabetes mellitus without complication, without long-term current use of insulin (HCC) - Plan: POCT glycosylated hemoglobin (Hb A1C)  Obesity (BMI 30-39.9)  Hypertension associated with diabetes ([place])  Hyperlipidemia associated with type 2 diabetes mellitus ([place])  Mild persistent asthma without complication  Seasonal allergic rhinitis, unspecified trigger Also recommend she add Rhinocort to her regimen to help with her allergies  1. Rx changes: none 2. Education: Reviewed 'ABCs' of diabetes management (respective goals in parentheses):  A1C (<7), blood pressure (<130/80), and cholesterol (LDL <100). 3. Compliance at present is estimated to be good. Efforts to improve compliance (if necessary) will be directed at increased exercise.  I again strongly encouraged her to get involved in regular exercise routine specifically walking 20 minutes daily or 150 minutes a week of something physical. 4. Follow up: 4 months

## 2019-04-19 NOTE — Patient Instructions (Signed)
Try Rhinocort daily to see if that helps with her allergy symptoms

## 2019-06-14 ENCOUNTER — Other Ambulatory Visit: Payer: Self-pay | Admitting: Family Medicine

## 2019-06-14 DIAGNOSIS — E119 Type 2 diabetes mellitus without complications: Secondary | ICD-10-CM

## 2019-06-27 ENCOUNTER — Other Ambulatory Visit: Payer: Self-pay | Admitting: Family Medicine

## 2019-06-27 DIAGNOSIS — E119 Type 2 diabetes mellitus without complications: Secondary | ICD-10-CM

## 2019-07-21 ENCOUNTER — Telehealth: Payer: Self-pay

## 2019-07-21 DIAGNOSIS — J4530 Mild persistent asthma, uncomplicated: Secondary | ICD-10-CM

## 2019-07-21 MED ORDER — ALBUTEROL SULFATE HFA 108 (90 BASE) MCG/ACT IN AERS: INHALATION_SPRAY | RESPIRATORY_TRACT | 0 refills | Status: DC

## 2019-07-21 NOTE — Telephone Encounter (Signed)
[facility]. Needs a refill on her Albuterol inhaler pt. Last apt was 04/19/19 next apt is 08/23/19.

## 2019-07-26 DIAGNOSIS — E119 Type 2 diabetes mellitus without complications: Secondary | ICD-10-CM | POA: Diagnosis not present

## 2019-08-03 DIAGNOSIS — Z1231 Encounter for screening mammogram for malignant neoplasm of breast: Secondary | ICD-10-CM | POA: Diagnosis not present

## 2019-08-03 LAB — HM MAMMOGRAPHY

## 2019-08-11 ENCOUNTER — Encounter: Payer: Self-pay | Admitting: Family Medicine

## 2019-08-23 ENCOUNTER — Encounter: Payer: Self-pay | Admitting: Medical

## 2019-08-23 ENCOUNTER — Ambulatory Visit (INDEPENDENT_AMBULATORY_CARE_PROVIDER_SITE_OTHER): Payer: Medicare HMO | Admitting: Medical

## 2019-08-23 ENCOUNTER — Other Ambulatory Visit: Payer: Self-pay

## 2019-08-23 VITALS — BP 150/84 | HR 90 | Ht 64.0 in | Wt 188.0 lb

## 2019-08-23 DIAGNOSIS — I152 Hypertension secondary to endocrine disorders: Secondary | ICD-10-CM

## 2019-08-23 DIAGNOSIS — Z7189 Other specified counseling: Secondary | ICD-10-CM | POA: Diagnosis not present

## 2019-08-23 DIAGNOSIS — E1159 Type 2 diabetes mellitus with other circulatory complications: Secondary | ICD-10-CM

## 2019-08-23 DIAGNOSIS — J4530 Mild persistent asthma, uncomplicated: Secondary | ICD-10-CM | POA: Diagnosis not present

## 2019-08-23 DIAGNOSIS — Z7185 Encounter for immunization safety counseling: Secondary | ICD-10-CM

## 2019-08-23 DIAGNOSIS — E669 Obesity, unspecified: Secondary | ICD-10-CM

## 2019-08-23 DIAGNOSIS — E1169 Type 2 diabetes mellitus with other specified complication: Secondary | ICD-10-CM

## 2019-08-23 DIAGNOSIS — E119 Type 2 diabetes mellitus without complications: Secondary | ICD-10-CM | POA: Diagnosis not present

## 2019-08-23 DIAGNOSIS — E785 Hyperlipidemia, unspecified: Secondary | ICD-10-CM | POA: Diagnosis not present

## 2019-08-23 DIAGNOSIS — I10 Essential (primary) hypertension: Secondary | ICD-10-CM

## 2019-08-23 NOTE — Progress Notes (Signed)
Subjective:  Linda Garcia is a 85 y.o. female who presents for Chief Complaint  Patient presents with  . Diabetes     Here for routine f/u on diabetes.    She normally sees Dr. Redmond School here.  However he had to leave abruptly today for personal issue, thus I am seeing her  She is here for routine follow-up, med check.  Diabetes - Checks sugars every morning.  Typically under 130 but sometimes up to 160 fasting.   She is compliant with Actos 15mg  daily, Metformin 850 mg twice daily.   No polyuria, no polydipsia.    HTN - not checking BP at home.   She is compliant with lisinopril HCT 20/12.5 mg daily.   No chest pain, no leg swelling.   Is careful with salt.  No prior stress test.    Hyperlipidemia - She is taking fish oil, Lipitor 10 mg daily  otherwise in normal state of health.   Sees eye doctor next week.  Checks feet daily, no current foot concern other than some occasional feet swelling.  No recent asthma concerns.   Compliant with medicaiton.  Nonsmoker.  Exercise - not much.    No other aggravating or relieving factors.    No other c/o.  The following portions of the patient's history were reviewed and updated as appropriate: allergies, current medications, past family history, past medical history, past social history, past surgical history and problem list.  ROS Otherwise as in subjective above  Objective: BP (!) 150/84   Pulse 90   Ht 5\' 4"  (1.626 m)   Wt 188 lb (85.3 kg)   SpO2 96%   BMI 32.27 kg/m   BP Readings from Last 3 Encounters:  08/23/19 (!) 150/84  04/19/19 132/80  12/16/18 132/86   Wt Readings from Last 3 Encounters:  08/23/19 188 lb (85.3 kg)  04/19/19 187 lb 12.8 oz (85.2 kg)  12/16/18 183 lb 9.6 oz (83.3 kg)   General appearance: alert, no distress, well developed, well nourished Lungs clear, no wheezing rhonchi or rales Heart regular rate and rhythm, normal S1-S2, no murmurs No edema in the extremities Upper extremity 2+  pulses  Diabetic Foot Exam - Simple   Simple Foot Form Visual Inspection See comments: Yes Sensation Testing Intact to touch and monofilament testing bilaterally: Yes Pulse Check See comments: Yes Comments Pedal pulses, she has some thickened callus lateral MTP fifth toe left foot and certainly on the fifth toe of the right foot and thickened callus on the MTP of the great toe right foot, relatively flat feet, otherwise unremarkable      Assessment: Encounter Diagnoses  Name Primary?  . Type 2 diabetes mellitus without complication, without long-term current use of insulin ([place]) Yes  . Hypertension associated with diabetes ([place])   . Hyperlipidemia associated with type 2 diabetes mellitus ([place])   . Obesity (BMI 30-39.9)   . Mild persistent asthma without complication   . Vaccine counseling      Plan: Diabetes-routine labs today, continue current medications, continue daily foot checks, continue glucose monitoring  Hypertension-blood pressure was elevated today unusual compared to her norm.  Continue current medications.  Routine labs today.   Hyperlipidemia-continue statin, fish oil, reviewed Lipids 12/2018 at goal  Obesity-encouraged her to work on some regular exercise, healthy diet and try to lose some weight  Asthma-stable, no concerns, continue current medication  Vaccine counseling-she is up-to-date on vaccines.  Advise yearly flu shot in the fall  [name] was  seen today for diabetes.  Diagnoses and all orders for this visit:  Type 2 diabetes mellitus without complication, without long-term current use of insulin (HCC) -     Comprehensive metabolic panel -     Hemoglobin A1c  Hypertension associated with diabetes ([place]) -     Comprehensive metabolic panel  Hyperlipidemia associated with type 2 diabetes mellitus (HCC)  Obesity (BMI 30-39.9)  Mild persistent asthma without complication  Vaccine counseling    Follow up: pending labs

## 2019-08-24 LAB — COMPREHENSIVE METABOLIC PANEL
ALT: 13 IU/L (ref 0–32)
AST: 18 IU/L (ref 0–40)
Albumin/Globulin Ratio: 1.6 (ref 1.2–2.2)
Albumin: 4.1 g/dL (ref 3.6–4.6)
Alkaline Phosphatase: 88 IU/L (ref 48–121)
BUN/Creatinine Ratio: 19 (ref 12–28)
BUN: 23 mg/dL (ref 8–27)
Bilirubin Total: 0.2 mg/dL (ref 0.0–1.2)
CO2: 24 mmol/L (ref 20–29)
Calcium: 9.6 mg/dL (ref 8.7–10.3)
Chloride: 105 mmol/L (ref 96–106)
Creatinine, Ser: 1.19 mg/dL — ABNORMAL HIGH (ref 0.57–1.00)
GFR calc Af Amer: 49 mL/min/{1.73_m2} — ABNORMAL LOW (ref >59)
GFR calc non Af Amer: 42 mL/min/{1.73_m2} — ABNORMAL LOW (ref >59)
Globulin, Total: 2.5 g/dL (ref 1.5–4.5)
Glucose: 146 mg/dL — ABNORMAL HIGH (ref 65–99)
Potassium: 4.3 mmol/L (ref 3.5–5.2)
Sodium: 144 mmol/L (ref 134–144)
Total Protein: 6.6 g/dL (ref 6.0–8.5)

## 2019-08-24 LAB — HEMOGLOBIN A1C
Est. average glucose Bld gHb Est-mCnc: 160 mg/dL
Hgb A1c MFr Bld: 7.2 % — ABNORMAL HIGH (ref 4.8–5.6)

## 2019-08-31 DIAGNOSIS — H52221 Regular astigmatism, right eye: Secondary | ICD-10-CM | POA: Diagnosis not present

## 2019-08-31 DIAGNOSIS — H524 Presbyopia: Secondary | ICD-10-CM | POA: Diagnosis not present

## 2019-08-31 DIAGNOSIS — E119 Type 2 diabetes mellitus without complications: Secondary | ICD-10-CM | POA: Diagnosis not present

## 2019-08-31 DIAGNOSIS — H25099 Other age-related incipient cataract, unspecified eye: Secondary | ICD-10-CM | POA: Diagnosis not present

## 2019-08-31 DIAGNOSIS — Z7984 Long term (current) use of oral hypoglycemic drugs: Secondary | ICD-10-CM | POA: Diagnosis not present

## 2019-08-31 DIAGNOSIS — H5203 Hypermetropia, bilateral: Secondary | ICD-10-CM | POA: Diagnosis not present

## 2019-08-31 DIAGNOSIS — H18419 Arcus senilis, unspecified eye: Secondary | ICD-10-CM | POA: Diagnosis not present

## 2019-08-31 LAB — HM DIABETES EYE EXAM

## 2019-09-07 ENCOUNTER — Encounter: Payer: Self-pay | Admitting: Family Medicine

## 2019-11-09 ENCOUNTER — Ambulatory Visit (INDEPENDENT_AMBULATORY_CARE_PROVIDER_SITE_OTHER): Payer: Medicare HMO

## 2019-11-09 ENCOUNTER — Other Ambulatory Visit: Payer: Self-pay

## 2019-11-09 DIAGNOSIS — Z23 Encounter for immunization: Secondary | ICD-10-CM | POA: Diagnosis not present

## 2019-11-14 ENCOUNTER — Encounter (HOSPITAL_COMMUNITY): Payer: Self-pay | Admitting: Emergency Medicine

## 2019-11-14 ENCOUNTER — Other Ambulatory Visit: Payer: Self-pay

## 2019-11-14 ENCOUNTER — Ambulatory Visit (HOSPITAL_COMMUNITY)
Admission: EM | Admit: 2019-11-14 | Discharge: 2019-11-14 | Disposition: A | Discharge status: Home / Self Care | Payer: Medicare HMO | Attending: Internal Medicine | Admitting: Internal Medicine

## 2019-11-14 DIAGNOSIS — R1013 Epigastric pain: Secondary | ICD-10-CM | POA: Diagnosis not present

## 2019-11-14 LAB — CBC WITH DIFFERENTIAL/PLATELET
Abs Immature Granulocytes: 0.02 10*3/uL (ref 0.00–0.07)
Basophils Absolute: 0 10*3/uL (ref 0.0–0.1)
Basophils Relative: 0 %
Eosinophils Absolute: 0.1 10*3/uL (ref 0.0–0.5)
Eosinophils Relative: 1 %
HCT: 41 % (ref 36.0–46.0)
Hemoglobin: 12.9 g/dL (ref 12.0–15.0)
Immature Granulocytes: 0 %
Lymphocytes Relative: 19 %
Lymphs Abs: 1.2 10*3/uL (ref 0.7–4.0)
MCH: 27.2 pg (ref 26.0–34.0)
MCHC: 31.5 g/dL (ref 30.0–36.0)
MCV: 86.5 fL (ref 80.0–100.0)
Monocytes Absolute: 0.6 10*3/uL (ref 0.1–1.0)
Monocytes Relative: 9 %
Neutro Abs: 4.7 10*3/uL (ref 1.7–7.7)
Neutrophils Relative %: 71 %
Platelets: 215 10*3/uL (ref 150–400)
RBC: 4.74 MIL/uL (ref 3.87–5.11)
RDW: 15.2 % (ref 11.5–15.5)
WBC: 6.6 10*3/uL (ref 4.0–10.5)
nRBC: 0 % (ref 0.0–0.2)

## 2019-11-14 LAB — COMPREHENSIVE METABOLIC PANEL
ALT: 18 U/L (ref 0–44)
AST: 24 U/L (ref 15–41)
Albumin: 3.9 g/dL (ref 3.5–5.0)
Alkaline Phosphatase: 79 U/L (ref 38–126)
Anion gap: 14 (ref 5–15)
BUN: 18 mg/dL (ref 8–23)
CO2: 26 mmol/L (ref 22–32)
Calcium: 10.1 mg/dL (ref 8.9–10.3)
Chloride: 99 mmol/L (ref 98–111)
Creatinine, Ser: 1.12 mg/dL — ABNORMAL HIGH (ref 0.44–1.00)
GFR, Estimated: 45 mL/min — ABNORMAL LOW (ref >60)
Glucose, Bld: 157 mg/dL — ABNORMAL HIGH (ref 70–99)
Potassium: 3.7 mmol/L (ref 3.5–5.1)
Sodium: 139 mmol/L (ref 135–145)
Total Bilirubin: 0.7 mg/dL (ref 0.3–1.2)
Total Protein: 7.3 g/dL (ref 6.5–8.1)

## 2019-11-14 MED ORDER — ALUM & MAG HYDROXIDE-SIMETH 200-200-20 MG/5ML PO SUSP
ORAL | Status: AC
  Filled 2019-11-14: qty 30

## 2019-11-14 MED ORDER — LIDOCAINE VISCOUS HCL 2 % MT SOLN
15.0000 mL | Freq: Once | OROMUCOSAL | Status: AC
  Administered 2019-11-14: 15 mL via ORAL

## 2019-11-14 MED ORDER — LIDOCAINE VISCOUS HCL 2 % MT SOLN
OROMUCOSAL | Status: AC
  Filled 2019-11-14: qty 15

## 2019-11-14 MED ORDER — ONDANSETRON 4 MG PO TBDP: 4.0000 mg | ORAL_TABLET | Freq: Three times a day (TID) | ORAL | 0 refills | Status: DC | PRN

## 2019-11-14 MED ORDER — ALUM & MAG HYDROXIDE-SIMETH 200-200-20 MG/5ML PO SUSP
30.0000 mL | Freq: Once | ORAL | Status: AC
  Administered 2019-11-14: 30 mL via ORAL

## 2019-11-14 MED ORDER — PANTOPRAZOLE SODIUM 20 MG PO TBEC: 20.0000 mg | DELAYED_RELEASE_TABLET | Freq: Every day | ORAL | 0 refills | Status: DC

## 2019-11-14 NOTE — ED Triage Notes (Signed)
Pt c/o abd cramping and emesis x 1 day. Pt states she has no appetite. She denies diarrhea.

## 2019-11-14 NOTE — Discharge Instructions (Addendum)
Take medications as prescribed If you have worsening abdominal pain, fever, persistent vomiting or yellowing of your eyes please return to the urgent care or go to the emergency department to be reevaluated.

## 2019-11-14 NOTE — ED Provider Notes (Signed)
[place]    CSN: [id number] Arrival date & time: 11/14/19  1406      History   Chief Complaint Chief Complaint  Patient presents with   Emesis    HPI Linda Garcia is a 85 y.o. female comes to urgent care with 1 day history of epigastric pain of insidious onset.  Patient says pain is aggravated by eating, no known relieving factors, no abdominal distention.  Patient continues to have good bowel movements.  No abdominal bloating.  Pain does not radiate to the right flank area of the subscapular area.  Is associated with vomiting which is nonbloody and nonbilious.Marland Kitchen   HPI  Past Medical History:  Diagnosis Date   Asthma    Diabetes mellitus    Diverticulosis    Dyslipidemia    Hypertension    Neuropathy in diabetes [facility])    Small bowel obstruction [facility])     Patient Active Problem List   Diagnosis Date Noted   Vaccine counseling 08/23/2019   Obesity (BMI 30-39.9) 07/15/2013   Allergic rhinitis, seasonal 06/20/2011   Diabetes mellitus ([place]) 11/01/2010   Asthma 11/01/2010   Hypertension associated with diabetes ([place]) 11/01/2010   Hyperlipidemia associated with type 2 diabetes mellitus ([place]) 11/01/2010    Past Surgical History:  Procedure Laterality Date   ABDOMINAL HYSTERECTOMY     COLONOSCOPY  2009    OB History   No obstetric history on file.      Home Medications    Prior to Admission medications   Medication Sig Start Date End Date Taking? Authorizing Provider  ADVAIR DISKUS 250-50 MCG/DOSE AEPB USE 1 INHALATION TWICE A DAY 11/11/18  Yes Denita Lung, MD  albuterol [facility] HFA) 108 (90 Base) MCG/ACT inhaler USE 2 INHALATIONS EVERY 6 HOURS AS NEEDED FOR WHEEZING OR SHORTNESS OF BREATH 07/21/19  Yes Denita Lung, MD  amoxicillin (AMOXIL) 875 MG tablet Take 1 tablet (875 mg total) by mouth 2 (two) times daily. 10/29/17  Yes Rita Ohara, MD  aspirin 81 MG tablet Take 81 mg by mouth daily.     Yes [provider]    atorvastatin (LIPITOR) 10 MG tablet Take 1 tablet (10 mg total) by mouth daily. 12/16/18  Yes Denita Lung, MD  fexofenadine (ALLEGRA) 180 MG tablet Take 180 mg by mouth daily.   Yes [provider]  fish oil-omega-3 fatty acids 1000 MG capsule Take 2 g by mouth daily.     Yes [provider]  glucose blood (FREESTYLE LITE) test strip Use as instructed 05/10/18  Yes Denita Lung, MD  Lancets (FREESTYLE) lancets Use as instructed 05/10/18  Yes Denita Lung, MD  lisinopril-hydrochlorothiazide (ZESTORETIC) 20-12.5 MG tablet TAKE 1 TABLET DAILY 04/11/19  Yes Denita Lung, MD  metFORMIN (GLUCOPHAGE) 850 MG tablet TAKE 1 TABLET TWICE A DAY WITH MEALS 06/27/19  Yes Denita Lung, MD  Multiple Vitamins-Minerals (MULTIVITAMIN WITH MINERALS) tablet Take 1 tablet by mouth daily.     Yes [provider]  pioglitazone (ACTOS) 15 MG tablet TAKE 1 TABLET DAILY 06/14/19  Yes Denita Lung, MD  ondansetron (ZOFRAN ODT) 4 MG disintegrating tablet Take 1 tablet (4 mg total) by mouth every 8 (eight) hours as needed for nausea or vomiting. 11/14/19   Poppi Scantling, Myrene Galas, MD  pantoprazole (PROTONIX) 20 MG tablet Take 1 tablet (20 mg total) by mouth daily. 11/14/19   Kobyn Kray, Myrene Galas, MD    Family History Family History  Problem Relation  Age of Onset   Diabetes Mother    Hypertension Mother    Heart disease Father     Social History Social History   Tobacco Use   Smoking status: Never Smoker   Smokeless tobacco: Never Used  Substance Use Topics   Alcohol use: No   Drug use: No     Allergies   Patient has no known allergies.   Review of Systems Review of Systems  Constitutional: Negative.   Gastrointestinal: Positive for abdominal pain, nausea and vomiting. Negative for diarrhea.  Genitourinary: Negative.   Musculoskeletal: Negative.   Neurological: Negative.      Physical Exam Triage Vital Signs ED Triage Vitals  Enc Vitals Group     BP 11/14/19  1607 (!) 164/102     Pulse Rate 11/14/19 1607 91     Resp 11/14/19 1607 17     Temp 11/14/19 1607 98 F (36.7 C)     Temp Source 11/14/19 1607 Oral     SpO2 11/14/19 1607 100 %     Weight --      Height --      Head Circumference --      Peak Flow --      Pain Score 11/14/19 1604 5     Pain Loc --      Pain Edu? --      Excl. in [place]? --    No data found.  Updated Vital Signs BP (!) 164/102 (BP Location: Right Arm)    Pulse 91    Temp 98 F (36.7 C) (Oral)    Resp 17    SpO2 100%   Visual Acuity Right Eye Distance:   Left Eye Distance:   Bilateral Distance:    Right Eye Near:   Left Eye Near:    Bilateral Near:     Physical Exam Vitals and nursing note reviewed.  Constitutional:      General: She is in acute distress.     Appearance: She is not ill-appearing.  Cardiovascular:     Rate and Rhythm: Normal rate and regular rhythm.     Pulses: Normal pulses.     Heart sounds: Normal heart sounds.  Pulmonary:     Effort: Pulmonary effort is normal.     Breath sounds: Normal breath sounds.  Abdominal:     General: Abdomen is flat. Bowel sounds are normal. There is no distension.     Tenderness: There is no abdominal tenderness. There is no guarding.  Musculoskeletal:        General: Normal range of motion.  Neurological:     Mental Status: She is alert.      UC Treatments / Results  Labs (all labs ordered are listed, but only abnormal results are displayed) Labs Reviewed  CBC WITH DIFFERENTIAL/PLATELET  COMPREHENSIVE METABOLIC PANEL    EKG   Radiology No results found.  Procedures Procedures (including critical care time)  Medications Ordered in UC Medications  alum & mag hydroxide-simeth (MAALOX/MYLANTA) 200-200-20 MG/5ML suspension 30 mL (30 mLs Oral Given 11/14/19 1651)    And  lidocaine (XYLOCAINE) 2 % viscous mouth solution 15 mL (15 mLs Oral Given 11/14/19 1651)    Initial Impression / Assessment and Plan / UC Course  I have reviewed the  triage vital signs and the nursing notes.  Pertinent labs & imaging results that were available during my care of the patient were reviewed by me and considered in my medical decision making (see chart for details).  1.  Acute gastritis without hematemesis: GI cocktail x1 dose-this brought significant relief to the patient CBC Zofran as needed for nausea/vomiting Protonix 20 mg orally daily for 30 days Return precautions given If patient continues to have epigastric symptoms at the end of 30 days, she may benefit from GI evaluation.   Final Clinical Impressions(s) / UC Diagnoses   Final diagnoses:  Abdominal pain, epigastric     Discharge Instructions     Take medications as prescribed If you have worsening abdominal pain, fever, persistent vomiting or yellowing of your eyes please return to the urgent care or go to the emergency department to be reevaluated.   ED Prescriptions    Medication Sig Dispense Auth. Provider   ondansetron (ZOFRAN ODT) 4 MG disintegrating tablet Take 1 tablet (4 mg total) by mouth every 8 (eight) hours as needed for nausea or vomiting. 20 tablet Kieana Livesay, Myrene Galas, MD   pantoprazole (PROTONIX) 20 MG tablet Take 1 tablet (20 mg total) by mouth daily. 30 tablet Marquasha Brutus, Myrene Galas, MD     PDMP not reviewed this encounter.   Chase Picket, MD 11/14/19 1740

## 2019-11-23 ENCOUNTER — Telehealth: Payer: Self-pay | Admitting: Family Medicine

## 2019-11-23 NOTE — Telephone Encounter (Signed)
Pt wanted to know if she should continue taking the pantoprazole that she was prescribe at the urgent care. Please advise [facility]

## 2019-11-23 NOTE — Telephone Encounter (Signed)
Let her know that those blood sugars look good.  Have her continue to monitor them.

## 2019-11-23 NOTE — Telephone Encounter (Signed)
Pt called she went to Urgent Care on 10/11 because she states " she could not keep anything on her stomach"  They gave her Pantoprazole 20mg . She is concerned about it bothering her blood sugars. She states that around the time she was sick and went to UC, her blood sugars were running around 171-179. She said now They are around 125. She just wanted to check with you about it

## 2019-11-24 NOTE — Telephone Encounter (Signed)
She can continue on this and we will discuss this again when she comes in for another visit

## 2019-11-25 ENCOUNTER — Other Ambulatory Visit: Payer: Self-pay

## 2019-11-25 NOTE — Telephone Encounter (Signed)
Pt was advised Linda Garcia 

## 2019-11-29 DIAGNOSIS — E119 Type 2 diabetes mellitus without complications: Secondary | ICD-10-CM | POA: Diagnosis not present

## 2019-12-27 ENCOUNTER — Encounter: Payer: Self-pay | Admitting: Family Medicine

## 2019-12-27 ENCOUNTER — Ambulatory Visit (INDEPENDENT_AMBULATORY_CARE_PROVIDER_SITE_OTHER): Payer: Medicare HMO | Admitting: Family Medicine

## 2019-12-27 ENCOUNTER — Other Ambulatory Visit: Payer: Self-pay

## 2019-12-27 VITALS — BP 150/92 | HR 98 | Temp 97.3°F | Wt 185.0 lb

## 2019-12-27 DIAGNOSIS — E785 Hyperlipidemia, unspecified: Secondary | ICD-10-CM

## 2019-12-27 DIAGNOSIS — J302 Other seasonal allergic rhinitis: Secondary | ICD-10-CM

## 2019-12-27 DIAGNOSIS — J4530 Mild persistent asthma, uncomplicated: Secondary | ICD-10-CM | POA: Diagnosis not present

## 2019-12-27 DIAGNOSIS — E669 Obesity, unspecified: Secondary | ICD-10-CM

## 2019-12-27 DIAGNOSIS — I152 Hypertension secondary to endocrine disorders: Secondary | ICD-10-CM

## 2019-12-27 DIAGNOSIS — E1159 Type 2 diabetes mellitus with other circulatory complications: Secondary | ICD-10-CM | POA: Diagnosis not present

## 2019-12-27 DIAGNOSIS — E119 Type 2 diabetes mellitus without complications: Secondary | ICD-10-CM

## 2019-12-27 DIAGNOSIS — E1169 Type 2 diabetes mellitus with other specified complication: Secondary | ICD-10-CM | POA: Diagnosis not present

## 2019-12-27 DIAGNOSIS — R609 Edema, unspecified: Secondary | ICD-10-CM

## 2019-12-27 MED ORDER — AMLODIPINE BESYLATE 5 MG PO TABS: 5.0000 mg | ORAL_TABLET | Freq: Every day | ORAL | 3 refills | Status: DC

## 2019-12-27 NOTE — Progress Notes (Signed)
  Subjective:    Patient ID: Linda Garcia, female    DOB: 03-04-1934, 85 y.o.   MRN: [id number]  Linda Garcia is a 85 y.o. female who presents for follow-up of Type 2 diabetes mellitus. Home blood sugar records: meter record , fasting, 106-179 Current symptoms/problems include none at this time. Daily foot checks:yes   Any foot concerns: dry skin Exercise: staying active but not in an active physical activity program. Diet: fair She continues on Glucophage and Actos and is having no difficulty with that.  She is also taking Zestoretic.  Continues on atorvastatin.  She is also taking her Advair and rarely uses her ProAir.  Her allergies seem to be under good control.  She has no other concerns or complaints. The following portions of the patient's history were reviewed and updated as appropriate: allergies, current medications, past medical history, past social history and problem list.  ROS as in subjective above.     Objective:    Physical Exam Alert and in no distress foot exam is normal.  2+ pitting edema is noted. Hemoglobin A1c is 7.2 Lab Review Diabetic Labs Latest Ref Rng & Units 11/14/2019 08/23/2019 04/19/2019 12/16/2018 08/24/2018  HbA1c 4.8 - 5.6 % - 7.2(H) 7.3(A) 7.3(A) 7.0  Microalbumin mg/L - - - 6.9 -  Micro/Creat Ratio - - - - 6.3 -  Chol 100 - 199 mg/dL - - - 133 -  HDL >39 mg/dL - - - 55 -  Calc LDL 0 - 99 mg/dL - - - 67 -  Triglycerides 0 - 149 mg/dL - - - 51 -  Creatinine 0.44 - 1.00 mg/dL 1.12(H) 1.19(H) - 1.12(H) -   BP/Weight 11/14/2019 08/23/2019 04/19/2019 46/96/2952 09/07/1322  Systolic BP 401 027 253 664 403  Diastolic BP 474 84 80 86 74  Wt. (Lbs) - 188 187.8 183.6 180.4  BMI - 32.27 32.24 31.51 30.97   Foot/eye exam completion dates Latest Ref Rng & Units 08/31/2019 12/16/2018  Eye Exam No Retinopathy No Retinopathy -  Foot exam Order - - -  Foot Form Completion - - Done    Linda Garcia  reports that she has never smoked. She has never used  smokeless tobacco. She reports that she does not drink alcohol and does not use drugs.     Assessment & Plan:    Hypertension associated with diabetes ([place])  Type 2 diabetes mellitus without complication, without long-term current use of insulin (HCC)  Mild persistent asthma without complication  Hyperlipidemia associated with type 2 diabetes mellitus (HCC)  Seasonal allergic rhinitis, unspecified trigger  Obesity (BMI 30-39.9)   1. Rx changes: I will add Norvasc to her regimen to help get her blood pressure under control. 2. Education: Reviewed 'ABCs' of diabetes management (respective goals in parentheses):  A1C (<7), blood pressure (<130/80), and cholesterol (LDL <100). 3. Compliance at present is estimated to be good. Efforts to improve compliance (if necessary) will be directed at increased exercise. 4. Follow up: 6 months Overall she seems to be doing a very good job of taking care of herself.  I am going to stretch it out to 6 months. Encouraged her to use support stockings and keep her feet elevated as much as possible.

## 2019-12-28 LAB — LIPID PANEL
Chol/HDL Ratio: 2.8 ratio (ref 0.0–4.4)
Cholesterol, Total: 154 mg/dL (ref 100–199)
HDL: 56 mg/dL (ref >39)
LDL Chol Calc (NIH): 85 mg/dL (ref 0–99)
Triglycerides: 66 mg/dL (ref 0–149)
VLDL Cholesterol Cal: 13 mg/dL (ref 5–40)

## 2020-01-04 ENCOUNTER — Other Ambulatory Visit: Payer: Self-pay | Admitting: Family Medicine

## 2020-01-04 DIAGNOSIS — J45909 Unspecified asthma, uncomplicated: Secondary | ICD-10-CM

## 2020-02-02 ENCOUNTER — Other Ambulatory Visit: Payer: Self-pay | Admitting: Family Medicine

## 2020-02-02 DIAGNOSIS — E1169 Type 2 diabetes mellitus with other specified complication: Secondary | ICD-10-CM

## 2020-03-09 DIAGNOSIS — E119 Type 2 diabetes mellitus without complications: Secondary | ICD-10-CM | POA: Diagnosis not present

## 2020-03-20 ENCOUNTER — Other Ambulatory Visit: Payer: Self-pay | Admitting: Family Medicine

## 2020-03-20 DIAGNOSIS — J45909 Unspecified asthma, uncomplicated: Secondary | ICD-10-CM

## 2020-03-20 NOTE — Telephone Encounter (Signed)
[facility]air . Please advise [facility]

## 2020-05-03 ENCOUNTER — Other Ambulatory Visit: Payer: Self-pay | Admitting: Family Medicine

## 2020-05-03 DIAGNOSIS — I152 Hypertension secondary to endocrine disorders: Secondary | ICD-10-CM

## 2020-05-03 DIAGNOSIS — E1159 Type 2 diabetes mellitus with other circulatory complications: Secondary | ICD-10-CM

## 2020-05-28 ENCOUNTER — Other Ambulatory Visit: Payer: Self-pay | Admitting: Family Medicine

## 2020-05-28 DIAGNOSIS — E119 Type 2 diabetes mellitus without complications: Secondary | ICD-10-CM

## 2020-05-29 ENCOUNTER — Other Ambulatory Visit: Payer: Self-pay | Admitting: Family Medicine

## 2020-05-29 DIAGNOSIS — J4530 Mild persistent asthma, uncomplicated: Secondary | ICD-10-CM

## 2020-05-29 NOTE — Telephone Encounter (Signed)
[facility] al[facility]erol inhaler. Please advise [facility]

## 2020-06-26 DIAGNOSIS — E119 Type 2 diabetes mellitus without complications: Secondary | ICD-10-CM | POA: Diagnosis not present

## 2020-06-27 ENCOUNTER — Ambulatory Visit (INDEPENDENT_AMBULATORY_CARE_PROVIDER_SITE_OTHER): Payer: Medicare HMO | Admitting: Family Medicine

## 2020-06-27 ENCOUNTER — Other Ambulatory Visit: Payer: Self-pay

## 2020-06-27 ENCOUNTER — Encounter: Payer: Self-pay | Admitting: Family Medicine

## 2020-06-27 VITALS — BP 128/74 | HR 88 | Temp 97.1°F | Ht 62.75 in | Wt 183.4 lb

## 2020-06-27 DIAGNOSIS — E1159 Type 2 diabetes mellitus with other circulatory complications: Secondary | ICD-10-CM | POA: Diagnosis not present

## 2020-06-27 DIAGNOSIS — E118 Type 2 diabetes mellitus with unspecified complications: Secondary | ICD-10-CM | POA: Diagnosis not present

## 2020-06-27 DIAGNOSIS — E1169 Type 2 diabetes mellitus with other specified complication: Secondary | ICD-10-CM | POA: Diagnosis not present

## 2020-06-27 DIAGNOSIS — J4530 Mild persistent asthma, uncomplicated: Secondary | ICD-10-CM

## 2020-06-27 DIAGNOSIS — J302 Other seasonal allergic rhinitis: Secondary | ICD-10-CM | POA: Diagnosis not present

## 2020-06-27 DIAGNOSIS — Z23 Encounter for immunization: Secondary | ICD-10-CM | POA: Diagnosis not present

## 2020-06-27 DIAGNOSIS — E669 Obesity, unspecified: Secondary | ICD-10-CM | POA: Diagnosis not present

## 2020-06-27 DIAGNOSIS — E785 Hyperlipidemia, unspecified: Secondary | ICD-10-CM | POA: Diagnosis not present

## 2020-06-27 DIAGNOSIS — I152 Hypertension secondary to endocrine disorders: Secondary | ICD-10-CM

## 2020-06-27 LAB — POCT GLYCOSYLATED HEMOGLOBIN (HGB A1C): Hemoglobin A1C: 7.2 % — AB (ref 4.0–5.6)

## 2020-06-27 NOTE — Progress Notes (Signed)
  Subjective:    Patient ID: Linda Garcia, female    DOB: Nov 03, 1934, 86 y.o.   MRN: [id number]  Linda Garcia is a 86 y.o. female who presents for follow-up of Type 2 diabetes mellitus.  Home blood sugar records: meter records, checking fasting and avg 134. Current symptoms/problems include none at this time. Daily foot checks:yes   Any foot concerns: none at this time per pt  Exercise: staying active.  She is not interested in getting involved in any kind of a physical activity program. Diet: fair -good She does have underlying asthma and allergies.  She is using Advair regularly and does occasionally use ProAir.  She continues on Actos and metformin and having no difficulty with that.  Her allergies are causing no difficulty.  Continues on atorvastatin.  She is taking lisinopril/HCTZ and amlodipine without a problem.  She does complain of intermittent left hip and flank pain but is unsure as to what makes it better or worse. The following portions of the patient's history were reviewed and updated as appropriate: allergies, current medications, past medical history, past social history and problem list.  ROS as in subjective above.     Objective:    Physical Exam Alert and in no distress otherwise not examined.   Lab Review Diabetic Labs Latest Ref Rng & Units 12/27/2019 11/14/2019 08/23/2019 04/19/2019 12/16/2018  HbA1c 4.8 - 5.6 % - - 7.2(H) 7.3(A) 7.3(A)  Microalbumin mg/L - - - - 6.9  Micro/Creat Ratio - - - - - 6.3  Chol 100 - 199 mg/dL 154 - - - 133  HDL >39 mg/dL 56 - - - 55  Calc LDL 0 - 99 mg/dL 85 - - - 67  Triglycerides 0 - 149 mg/dL 66 - - - 51  Creatinine 0.44 - 1.00 mg/dL - 1.12(H) 1.19(H) - 1.12(H)   BP/Weight 12/27/2019 11/14/2019 08/23/2019 04/19/2019 37/90/2409  Systolic BP 735 329 924 268 341  Diastolic BP 92 962 84 80 86  Wt. (Lbs) 185 - 188 187.8 183.6  BMI 31.76 - 32.27 32.24 31.51   Foot/eye exam completion dates Latest Ref Rng & Units  12/27/2019 08/31/2019  Eye Exam No Retinopathy - No Retinopathy  Foot exam Order - - -  Foot Form Completion - Done -  Hemoglobin A1c is 7.2  Linda Garcia  reports that she has never smoked. She has never used smokeless tobacco. She reports that she does not drink alcohol and does not use drugs.     Assessment & Plan:    Diabetes mellitus type 2 with complications ([place]) - Plan: POCT glycosylated hemoglobin (Hb A1C)  Immunization, viral disease - Plan: PFIZER Comirnaty(GRAY TOP)COVID-19 Vaccine  Hypertension associated with diabetes ([place])  Mild persistent asthma without complication  Hyperlipidemia associated with type 2 diabetes mellitus (HCC)  Seasonal allergic rhinitis, unspecified trigger  Obesity (BMI 30-39.9)   1. Rx changes: none 2. Education: Reviewed 'ABCs' of diabetes management (respective goals in parentheses):  A1C (<7), blood pressure (<130/80), and cholesterol (LDL <100). 3. Compliance at present is estimated to be fair. Efforts to improve compliance (if necessary) will be directed at increased exercise. 4. Follow up: 6 months Again encouraged her to become more physically active.

## 2020-08-03 ENCOUNTER — Other Ambulatory Visit: Payer: Self-pay | Admitting: Family Medicine

## 2020-08-03 DIAGNOSIS — E1169 Type 2 diabetes mellitus with other specified complication: Secondary | ICD-10-CM

## 2020-08-08 ENCOUNTER — Other Ambulatory Visit: Payer: Self-pay | Admitting: Family Medicine

## 2020-08-08 DIAGNOSIS — Z1231 Encounter for screening mammogram for malignant neoplasm of breast: Secondary | ICD-10-CM | POA: Diagnosis not present

## 2020-08-08 DIAGNOSIS — E119 Type 2 diabetes mellitus without complications: Secondary | ICD-10-CM

## 2020-08-08 LAB — HM MAMMOGRAPHY

## 2020-08-21 ENCOUNTER — Telehealth: Payer: Self-pay | Admitting: Family Medicine

## 2020-08-21 DIAGNOSIS — E785 Hyperlipidemia, unspecified: Secondary | ICD-10-CM | POA: Diagnosis not present

## 2020-08-21 DIAGNOSIS — Z7951 Long term (current) use of inhaled steroids: Secondary | ICD-10-CM | POA: Diagnosis not present

## 2020-08-21 DIAGNOSIS — E1136 Type 2 diabetes mellitus with diabetic cataract: Secondary | ICD-10-CM | POA: Diagnosis not present

## 2020-08-21 DIAGNOSIS — Z7984 Long term (current) use of oral hypoglycemic drugs: Secondary | ICD-10-CM | POA: Diagnosis not present

## 2020-08-21 DIAGNOSIS — J45909 Unspecified asthma, uncomplicated: Secondary | ICD-10-CM | POA: Diagnosis not present

## 2020-08-21 DIAGNOSIS — Z7982 Long term (current) use of aspirin: Secondary | ICD-10-CM | POA: Diagnosis not present

## 2020-08-21 DIAGNOSIS — Z7722 Contact with and (suspected) exposure to environmental tobacco smoke (acute) (chronic): Secondary | ICD-10-CM | POA: Diagnosis not present

## 2020-08-21 DIAGNOSIS — Z6832 Body mass index (BMI) 32.0-32.9, adult: Secondary | ICD-10-CM | POA: Diagnosis not present

## 2020-08-21 DIAGNOSIS — I10 Essential (primary) hypertension: Secondary | ICD-10-CM | POA: Diagnosis not present

## 2020-08-21 DIAGNOSIS — E669 Obesity, unspecified: Secondary | ICD-10-CM | POA: Diagnosis not present

## 2020-08-21 NOTE — Telephone Encounter (Signed)
Pt called and states that [employer] from her insurance come out today and states that she checked her Bp and it was 183/89 and that her feet have been swelling, and that the woman thinks that she may need some lasix  Tried to get pt to come in pt states she wanted to see what you thought before making a appt

## 2020-08-23 ENCOUNTER — Ambulatory Visit
Admission: RE | Admit: 2020-08-23 | Discharge: 2020-08-23 | Disposition: A | Discharge status: Home / Self Care | Payer: Medicare HMO | Source: Ambulatory Visit | Attending: Family Medicine | Admitting: Family Medicine

## 2020-08-23 ENCOUNTER — Other Ambulatory Visit: Payer: Self-pay

## 2020-08-23 ENCOUNTER — Ambulatory Visit (INDEPENDENT_AMBULATORY_CARE_PROVIDER_SITE_OTHER): Payer: Medicare HMO | Admitting: Family Medicine

## 2020-08-23 VITALS — BP 142/90 | HR 90 | Temp 97.8°F | Wt 185.4 lb

## 2020-08-23 DIAGNOSIS — I4510 Unspecified right bundle-branch block: Secondary | ICD-10-CM | POA: Diagnosis not present

## 2020-08-23 DIAGNOSIS — R0602 Shortness of breath: Secondary | ICD-10-CM | POA: Diagnosis not present

## 2020-08-23 DIAGNOSIS — R011 Cardiac murmur, unspecified: Secondary | ICD-10-CM

## 2020-08-23 DIAGNOSIS — E1159 Type 2 diabetes mellitus with other circulatory complications: Secondary | ICD-10-CM

## 2020-08-23 DIAGNOSIS — I152 Hypertension secondary to endocrine disorders: Secondary | ICD-10-CM

## 2020-08-23 NOTE — Progress Notes (Signed)
   Subjective:    Patient ID: Linda Garcia, female    DOB: 09-11-1934, 86 y.o.   MRN: [id number]  HPI She is here for evaluation of her blood pressure and edema.  Apparently she was seen by home health nurse recently and her blood pressure was noted to be elevated.  She also complains of intermittent leg edema that does get worse as the day goes on.  She has not had any chest pain, shortness of breath, PND or DOE.  She continues on amlodipine and lisinopril/HCTZ.   Review of Systems     Objective:   Physical Exam Alert and in no distress.  Cardiac exam does show a heart rate with a 2-3/6 SEM  EKG read by me does show RBBB with questionable left atrial enlargement.     Assessment & Plan:  Systolic murmur - Plan: DG Chest 2 View, CBC with Differential/Platelet, Comprehensive metabolic panel, Lipid panel, EKG 12-Lead, ECHOCARDIOGRAM COMPLETE  Hypertension associated with diabetes ([place]) - Plan: CBC with Differential/Platelet, Comprehensive metabolic panel  RBBB - Plan: CBC with Differential/Platelet, Comprehensive metabolic panel, Lipid panel, EKG 12-Lead The murmur is new however she is having no difficulties at the present time.  EKG does show the RBBB.  I will also set her up for echocardiogram.

## 2020-08-24 LAB — CBC WITH DIFFERENTIAL/PLATELET
Basophils Absolute: 0 10*3/uL (ref 0.0–0.2)
Basos: 0 %
EOS (ABSOLUTE): 0.2 10*3/uL (ref 0.0–0.4)
Eos: 3 %
Hematocrit: 34.5 % (ref 34.0–46.6)
Hemoglobin: 10.8 g/dL — ABNORMAL LOW (ref 11.1–15.9)
Immature Grans (Abs): 0 10*3/uL (ref 0.0–0.1)
Immature Granulocytes: 0 %
Lymphocytes Absolute: 1.5 10*3/uL (ref 0.7–3.1)
Lymphs: 24 %
MCH: 26.7 pg (ref 26.6–33.0)
MCHC: 31.3 g/dL — ABNORMAL LOW (ref 31.5–35.7)
MCV: 85 fL (ref 79–97)
Monocytes Absolute: 0.5 10*3/uL (ref 0.1–0.9)
Monocytes: 8 %
Neutrophils Absolute: 4.1 10*3/uL (ref 1.4–7.0)
Neutrophils: 65 %
Platelets: 189 10*3/uL (ref 150–450)
RBC: 4.04 x10E6/uL (ref 3.77–5.28)
RDW: 14.4 % (ref 11.7–15.4)
WBC: 6.3 10*3/uL (ref 3.4–10.8)

## 2020-08-24 LAB — COMPREHENSIVE METABOLIC PANEL
ALT: 18 IU/L (ref 0–32)
AST: 20 IU/L (ref 0–40)
Albumin/Globulin Ratio: 1.6 (ref 1.2–2.2)
Albumin: 4.2 g/dL (ref 3.6–4.6)
Alkaline Phosphatase: 98 IU/L (ref 44–121)
BUN/Creatinine Ratio: 19 (ref 12–28)
BUN: 20 mg/dL (ref 8–27)
Bilirubin Total: 0.2 mg/dL (ref 0.0–1.2)
CO2: 24 mmol/L (ref 20–29)
Calcium: 9.2 mg/dL (ref 8.7–10.3)
Chloride: 101 mmol/L (ref 96–106)
Creatinine, Ser: 1.08 mg/dL — ABNORMAL HIGH (ref 0.57–1.00)
Globulin, Total: 2.7 g/dL (ref 1.5–4.5)
Glucose: 147 mg/dL — ABNORMAL HIGH (ref 65–99)
Potassium: 4.2 mmol/L (ref 3.5–5.2)
Sodium: 141 mmol/L (ref 134–144)
Total Protein: 6.9 g/dL (ref 6.0–8.5)
eGFR: 51 mL/min/{1.73_m2} — ABNORMAL LOW (ref >59)

## 2020-08-24 LAB — LIPID PANEL
Chol/HDL Ratio: 2.4 ratio (ref 0.0–4.4)
Cholesterol, Total: 151 mg/dL (ref 100–199)
HDL: 64 mg/dL (ref >39)
LDL Chol Calc (NIH): 77 mg/dL (ref 0–99)
Triglycerides: 46 mg/dL (ref 0–149)
VLDL Cholesterol Cal: 10 mg/dL (ref 5–40)

## 2020-09-05 ENCOUNTER — Other Ambulatory Visit: Payer: Self-pay | Admitting: Family Medicine

## 2020-09-05 DIAGNOSIS — E119 Type 2 diabetes mellitus without complications: Secondary | ICD-10-CM

## 2020-10-11 DIAGNOSIS — E119 Type 2 diabetes mellitus without complications: Secondary | ICD-10-CM | POA: Diagnosis not present

## 2020-10-30 ENCOUNTER — Ambulatory Visit (INDEPENDENT_AMBULATORY_CARE_PROVIDER_SITE_OTHER): Payer: Medicare HMO | Admitting: Family Medicine

## 2020-10-30 ENCOUNTER — Encounter: Payer: Self-pay | Admitting: Family Medicine

## 2020-10-30 ENCOUNTER — Other Ambulatory Visit: Payer: Self-pay

## 2020-10-30 VITALS — BP 138/88 | HR 87 | Temp 97.4°F | Ht 63.75 in | Wt 183.0 lb

## 2020-10-30 DIAGNOSIS — R609 Edema, unspecified: Secondary | ICD-10-CM

## 2020-10-30 DIAGNOSIS — E1169 Type 2 diabetes mellitus with other specified complication: Secondary | ICD-10-CM | POA: Diagnosis not present

## 2020-10-30 DIAGNOSIS — E785 Hyperlipidemia, unspecified: Secondary | ICD-10-CM

## 2020-10-30 DIAGNOSIS — Z23 Encounter for immunization: Secondary | ICD-10-CM | POA: Diagnosis not present

## 2020-10-30 DIAGNOSIS — Z0000 Encounter for general adult medical examination without abnormal findings: Secondary | ICD-10-CM

## 2020-10-30 DIAGNOSIS — J4530 Mild persistent asthma, uncomplicated: Secondary | ICD-10-CM

## 2020-10-30 DIAGNOSIS — I152 Hypertension secondary to endocrine disorders: Secondary | ICD-10-CM

## 2020-10-30 DIAGNOSIS — J302 Other seasonal allergic rhinitis: Secondary | ICD-10-CM | POA: Diagnosis not present

## 2020-10-30 DIAGNOSIS — Z7185 Encounter for immunization safety counseling: Secondary | ICD-10-CM

## 2020-10-30 DIAGNOSIS — R011 Cardiac murmur, unspecified: Secondary | ICD-10-CM | POA: Diagnosis not present

## 2020-10-30 DIAGNOSIS — E1159 Type 2 diabetes mellitus with other circulatory complications: Secondary | ICD-10-CM | POA: Diagnosis not present

## 2020-10-30 LAB — POCT GLYCOSYLATED HEMOGLOBIN (HGB A1C): Hemoglobin A1C: 6.8 % — AB (ref 4.0–5.6)

## 2020-10-30 MED ORDER — LISINOPRIL-HYDROCHLOROTHIAZIDE 20-12.5 MG PO TABS: 1.0000 | ORAL_TABLET | Freq: Every day | ORAL | 3 refills | Status: DC

## 2020-10-30 MED ORDER — AMLODIPINE BESYLATE 5 MG PO TABS: 5.0000 mg | ORAL_TABLET | Freq: Every day | ORAL | 3 refills | Status: DC

## 2020-10-30 NOTE — Progress Notes (Signed)
Linda Garcia is a 86 y.o. female who presents for annual wellness visit,CPE and follow-up on chronic medical conditions.  She has no particular concerns or complaints.  She has had difficulty taking amlodipine and lisinopril/HCTZ at the same time.  Apparently it makes her quite fatigued.  She does have underlying asthma and is using the inhaler regularly.  She does have albuterol for rescue.  Her allergies seem to be under good control.  She continues on Actos as well as metformin and having no difficulty with that.  Lipitor is causing no difficulty.  She apparently has not been using Protonix recently.  She does complain of some slight fluid retention mainly in her feet.  Her physical activity is quite limited.  Immunizations and Health Maintenance Immunization History  Administered Date(s) Administered   Fluad Quad(high Dose 65+) 10/29/2018, 11/09/2019, 10/30/2020   Influenza Split 11/01/2010, 10/27/2011   Influenza Whole 11/14/2009   Influenza, High Dose Seasonal PF 11/16/2013, 10/02/2014, 11/01/2015, 12/02/2016   Influenza,inj,Quad PF,6+ Mos 10/27/2012   Influenza-Unspecified 10/04/2017   Moderna Covid-19 Vaccine Bivalent Booster 1yrs & up 10/30/2020   PFIZER Comirnaty(Gray Top)Covid-19 Tri-Sucrose Vaccine 06/27/2020   PFIZER(Purple Top)SARS-COV-2 Vaccination 02/24/2019, 03/17/2019, 11/09/2019   Pneumococcal Conjugate-13 07/16/2016   Pneumococcal Polysaccharide-23 02/04/2008, 07/15/2013   Td 06/11/2005   Tdap 11/09/2015   Zoster Recombinat (Shingrix) 10/01/2017, 12/02/2017   Zoster, Live 08/03/2012   There are no preventive care reminders to display for this patient.   Last Pap smear: aged out Last mammogram: 08/08/20 Last colonoscopy:11/12/2007 Last DEXA:  11/16/2007 Dentist over two years Ophtho: Q year Exercise:N/A  Other doctors caring for patient include: N/A  Advanced directives: Does Patient Have a Medical Advance Directive?: No Would patient like information on  creating a medical advance directive?: Yes (ED - Information included in AVS)  Depression screen:  See questionnaire below.  Depression screen [facility] 2/9 10/30/2020 12/27/2019 04/06/2018 12/01/2017 12/02/2016  Decreased Interest 0 0 0 0 0  Down, Depressed, Hopeless 0 0 0 0 0  PHQ - 2 Score 0 0 0 0 0    Fall Risk Screen: see questionnaire below. Fall Risk  10/30/2020 12/27/2019 04/06/2018 12/24/2017 12/01/2017  Falls in the past year? 0 0 0 0 No  Comment - - - [place] Telephone Survey: data to providers prior to load -  Number falls in past yr: 0 - - - -  Injury with Fall? 0 - - - -  Risk for fall due to : No Fall Risks - - - -  Follow up Falls evaluation completed - - - -    ADL screen:  See questionnaire below Functional Status Survey: Is the patient deaf or have difficulty hearing?: No Does the patient have difficulty seeing, even when wearing glasses/contacts?: No Does the patient have difficulty concentrating, remembering, or making decisions?: No Does the patient have difficulty walking or climbing stairs?: No Does the patient have difficulty dressing or bathing?: No Does the patient have difficulty doing errands alone such as visiting a doctor's office or shopping?: No   Review of Systems Constitutional: -, -unexpected weight change, -anorexia, -fatigue Allergy: -sneezing, -itching, -congestion Dermatology: denies changing moles, rash, lumps ENT: -runny nose, -ear pain, -sore throat,  Cardiology:  -chest pain, -palpitations, -orthopnea, Respiratory: -cough, -shortness of breath, -dyspnea on exertion, -wheezing,  Gastroenterology: -abdominal pain, -nausea, -vomiting, -diarrhea, -constipation, -dysphagia Hematology: -bleeding or bruising problems Musculoskeletal: -arthralgias, -myalgias, -joint swelling, -back pain, - Ophthalmology: -vision changes,  Urology: -dysuria, -difficulty urinating,  -urinary frequency, -urgency, incontinence  Neurology: -, -numbness, , -memory loss, -falls,  -dizziness    PHYSICAL EXAM:  General Appearance: Alert, cooperative, no distress, appears stated age Head: Normocephalic, without obvious abnormality, atraumatic Eyes: PERRL, conjunctiva/corneas clear, EOM's intact, fundi benign Ears: Normal TM's and external ear canals Nose: Nares normal, mucosa normal, no drainage or sinus tenderness Throat: Lips, mucosa, and tongue normal; teeth and gums normal Neck: Supple, no lymphadenopathy;  thyroid:  no enlargement/tenderness/nodules; no carotid bruit or JVD Lungs: Clear to auscultation bilaterally without wheezes, rales or ronchi; respirations unlabored Heart: Regular rate and rhythm, S1 and S2 normal, 3/6 SEM, no rub or gallop Abdomen: Soft, non-tender, nondistended, normoactive bowel sounds,  no masses, no hepatosplenomegaly Extremities: No clubbing, cyanosis or edema Pulses: 2+ and symmetric all extremities Skin:  Skin color, texture, turgor normal, no rashes or lesions Lymph nodes: Cervical, supraclavicular, and axillary nodes normal Neurologic:  CNII-XII intact, normal strength, sensation and gait; reflexes 2+ and symmetric throughout Psych: Normal mood, affect, hygiene and grooming. Hemoglobin A1c is 6.8 ASSESSMENT/PLAN: Routine general medical examination at a health care facility  Hypertension associated with diabetes ([place]) - Plan: amLODipine (NORVASC) 5 MG tablet, lisinopril-hydrochlorothiazide (ZESTORETIC) 20-12.5 MG tablet  Seasonal allergic rhinitis, unspecified trigger  Mild persistent asthma without complication  Type 2 diabetes mellitus with other circulatory complication, without long-term current use of insulin ([place]) - Plan: POCT glycosylated hemoglobin (Hb A1C)  Hyperlipidemia associated with type 2 diabetes mellitus (HCC)  Dependent edema  Vaccine counseling - Plan: Flu Vaccine QUAD High Dose(Fluad)  Need for influenza vaccination - Plan: Flu Vaccine QUAD High Dose(Fluad)  Need for COVID-19 vaccine - Plan:  Moderna Covid-19 Vaccine Bivalent Booster  Morbid obesity ([place])  Systolic murmur - Plan: ECHOCARDIOGRAM COMPLETE  Apparently the echocardiogram is pending but I sent it through again to ensure that it gets done.  Encouraged her to stay on her present medication regimen.  She can split up the dosing of her blood pressure medications to see if that makes it more tolerable for her.  Again strongly encouraged her to become more physically active.  Explained that that would also help remove the swelling in her feet   Immunization recommendations discussed.  Colonoscopy recommendations reviewed   Medicare Attestation I have personally reviewed: The patient's medical and social history Their use of alcohol, tobacco or illicit drugs Their current medications and supplements The patient's functional ability including ADLs,fall risks, home safety risks, cognitive, and hearing and visual impairment Diet and physical activities Evidence for depression or mood disorders  The patient's weight, height, and BMI have been recorded in the chart.  I have made referrals, counseling, and provided education to the patient based on review of the above and I have provided the patient with a written personalized care plan for preventive services.     Jill Alexanders, MD   10/30/2020

## 2020-11-01 ENCOUNTER — Telehealth: Payer: Self-pay

## 2020-11-01 NOTE — Telephone Encounter (Signed)
Needing call back concerning pt echo . [place]

## 2020-11-29 ENCOUNTER — Ambulatory Visit (HOSPITAL_COMMUNITY)
Admission: RE | Admit: 2020-11-29 | Discharge: 2020-11-29 | Disposition: A | Discharge status: Home / Self Care | Payer: Medicare HMO | Source: Ambulatory Visit | Attending: Family Medicine | Admitting: Family Medicine

## 2020-11-29 ENCOUNTER — Other Ambulatory Visit: Payer: Self-pay

## 2020-11-29 DIAGNOSIS — I10 Essential (primary) hypertension: Secondary | ICD-10-CM | POA: Insufficient documentation

## 2020-11-29 DIAGNOSIS — R011 Cardiac murmur, unspecified: Secondary | ICD-10-CM | POA: Insufficient documentation

## 2020-11-29 DIAGNOSIS — E785 Hyperlipidemia, unspecified: Secondary | ICD-10-CM | POA: Diagnosis not present

## 2020-11-29 DIAGNOSIS — E119 Type 2 diabetes mellitus without complications: Secondary | ICD-10-CM | POA: Insufficient documentation

## 2020-11-29 DIAGNOSIS — I350 Nonrheumatic aortic (valve) stenosis: Secondary | ICD-10-CM | POA: Diagnosis not present

## 2020-11-29 NOTE — Progress Notes (Signed)
  Echocardiogram 2D Echocardiogram has been performed.  Darlina Sicilian M 11/29/2020, 3:42 PM

## 2020-11-30 LAB — ECHOCARDIOGRAM COMPLETE
AR max vel: 1.16 cm2
AV Area VTI: 1.05 cm2
AV Area mean vel: 0.98 cm2
AV Mean grad: 22.8 mmHg
AV Peak grad: 38.9 mmHg
Ao pk vel: 3.12 m/s
Area-P 1/2: 3.28 cm2
S' Lateral: 1.85 cm

## 2020-12-06 ENCOUNTER — Encounter: Payer: Self-pay | Admitting: Family Medicine

## 2020-12-06 ENCOUNTER — Other Ambulatory Visit: Payer: Self-pay

## 2020-12-06 ENCOUNTER — Telehealth (INDEPENDENT_AMBULATORY_CARE_PROVIDER_SITE_OTHER): Payer: Medicare HMO | Admitting: Family Medicine

## 2020-12-06 VITALS — BP 127/72 | HR 96 | Temp 97.1°F | Wt 183.0 lb

## 2020-12-06 DIAGNOSIS — I350 Nonrheumatic aortic (valve) stenosis: Secondary | ICD-10-CM | POA: Diagnosis not present

## 2020-12-06 NOTE — Progress Notes (Signed)
   Subjective:    Patient ID: Linda Garcia, female    DOB: [birth year], 86 y.o.   MRN: [id number]  HPI Documentation for virtual audio and video telecommunications through [place] encounter:  The patient was located at home. 2 patient identifiers used.  The provider was located in the office. The patient did consent to this visit and is aware of possible charges through their insurance for this visit.  The other persons participating in this telemedicine service were none. Time spent on call was 5 minutes and in review of previous records >10 minutes total for counseling and coordination of care.  This virtual service is not related to other E/M service within previous 7 days.  Today's visit is to discuss echocardiogram done due to hearing a systolic murmur.  Review of Systems     Objective:   Physical Exam Alert and in no distress otherwise not examined       Assessment & Plan:  Aortic valve stenosis, etiology of cardiac valve disease unspecified I explained the diagnosis of aortic stenosis and potentially symptoms that she can have from this in terms of chest pain, shortness of breath, syncopal episodes.  Explained the fact that we will eventually need to do another echocardiogram to assess progression of the disease.  She expressed understanding of this.  Did suggest that she go to [employer] and look up aortic stenosis to get a better feel for this.  She was comfortable with that.

## 2020-12-26 DIAGNOSIS — H52223 Regular astigmatism, bilateral: Secondary | ICD-10-CM | POA: Diagnosis not present

## 2020-12-26 DIAGNOSIS — E119 Type 2 diabetes mellitus without complications: Secondary | ICD-10-CM | POA: Diagnosis not present

## 2020-12-26 DIAGNOSIS — H5203 Hypermetropia, bilateral: Secondary | ICD-10-CM | POA: Diagnosis not present

## 2020-12-26 DIAGNOSIS — H524 Presbyopia: Secondary | ICD-10-CM | POA: Diagnosis not present

## 2020-12-26 DIAGNOSIS — Z135 Encounter for screening for eye and ear disorders: Secondary | ICD-10-CM | POA: Diagnosis not present

## 2020-12-26 DIAGNOSIS — H2513 Age-related nuclear cataract, bilateral: Secondary | ICD-10-CM | POA: Diagnosis not present

## 2020-12-26 LAB — HM DIABETES EYE EXAM

## 2021-01-10 DIAGNOSIS — E119 Type 2 diabetes mellitus without complications: Secondary | ICD-10-CM | POA: Diagnosis not present

## 2021-02-04 ENCOUNTER — Other Ambulatory Visit: Payer: Self-pay | Admitting: Family Medicine

## 2021-02-04 DIAGNOSIS — E785 Hyperlipidemia, unspecified: Secondary | ICD-10-CM

## 2021-02-04 DIAGNOSIS — E1169 Type 2 diabetes mellitus with other specified complication: Secondary | ICD-10-CM

## 2021-02-26 ENCOUNTER — Other Ambulatory Visit: Payer: Self-pay | Admitting: Family Medicine

## 2021-02-26 DIAGNOSIS — J45909 Unspecified asthma, uncomplicated: Secondary | ICD-10-CM

## 2021-02-26 DIAGNOSIS — E119 Type 2 diabetes mellitus without complications: Secondary | ICD-10-CM

## 2021-03-01 ENCOUNTER — Ambulatory Visit: Payer: Medicare HMO | Admitting: Family Medicine

## 2021-03-01 NOTE — Progress Notes (Deleted)
°  Subjective:    Patient ID: Linda Garcia, female    DOB: 06-07-1934, 87 y.o.   MRN: [id number]  Linda Garcia is a 87 y.o. female who presents for follow-up of Type 2 diabetes mellitus.  Home blood sugar records: {diabetes glucometry results:16657} Current symptoms/problems include {Symptoms; diabetes:14075} and have been {Desc; course:15616}. Daily foot checks:   Any foot concerns: *** Exercise: {types:19826} Diet: The following portions of the patient's history were reviewed and updated as appropriate: allergies, current medications, past medical history, past social history and problem list.  ROS as in subjective above.     Objective:    Physical Exam Alert and in no distress otherwise not examined.  There were no vitals taken for this visit.  Lab Review Diabetic Labs Latest Ref Rng & Units 10/30/2020 08/23/2020 06/27/2020 12/27/2019 11/14/2019  HbA1c 4.0 - 5.6 % 6.8(A) - 7.2(A) - -  Microalbumin mg/L - - - - -  Micro/Creat Ratio - - - - - -  Chol 100 - 199 mg/dL - 151 - 154 -  HDL >39 mg/dL - 64 - 56 -  Calc LDL 0 - 99 mg/dL - 77 - 85 -  Triglycerides 0 - 149 mg/dL - 46 - 66 -  Creatinine 0.57 - 1.00 mg/dL - 1.08(H) - - 1.12(H)   BP/Weight 12/06/2020 10/30/2020 08/23/2020 06/27/2020 41/74/0814  Systolic BP 481 856 314 970 263  Diastolic BP 72 88 90 74 92  Wt. (Lbs) 183 183 185.4 183.4 185  BMI 31.66 31.66 33.1 32.75 31.76   Foot/eye exam completion dates Latest Ref Rng & Units 12/27/2019 08/31/2019  Eye Exam No Retinopathy - No Retinopathy  Foot exam Order - - -  Foot Form Completion - Done -    Linda Garcia  reports that she has never smoked. She has never used smokeless tobacco. She reports that she does not drink alcohol and does not use drugs.     Assessment & Plan:    No diagnosis found.  Rx changes: {none:33079} Education: Reviewed ABCs of diabetes management (respective goals in parentheses):  A1C (<7), blood pressure (<130/80), and cholesterol  (LDL <100). Compliance at present is estimated to be {good/fair/poor:33178}. Efforts to improve compliance (if necessary) will be directed at {compliance:16716}. Follow up: {NUMBERS; 0-10:33138} {time:11}

## 2021-03-04 ENCOUNTER — Other Ambulatory Visit: Payer: Self-pay

## 2021-03-04 ENCOUNTER — Ambulatory Visit (INDEPENDENT_AMBULATORY_CARE_PROVIDER_SITE_OTHER): Payer: Medicare HMO | Admitting: Family Medicine

## 2021-03-04 VITALS — BP 138/70 | HR 98 | Temp 98.0°F | Wt 180.4 lb

## 2021-03-04 DIAGNOSIS — J302 Other seasonal allergic rhinitis: Secondary | ICD-10-CM | POA: Diagnosis not present

## 2021-03-04 DIAGNOSIS — E1159 Type 2 diabetes mellitus with other circulatory complications: Secondary | ICD-10-CM

## 2021-03-04 DIAGNOSIS — J4530 Mild persistent asthma, uncomplicated: Secondary | ICD-10-CM | POA: Diagnosis not present

## 2021-03-04 DIAGNOSIS — I152 Hypertension secondary to endocrine disorders: Secondary | ICD-10-CM

## 2021-03-04 DIAGNOSIS — I350 Nonrheumatic aortic (valve) stenosis: Secondary | ICD-10-CM | POA: Diagnosis not present

## 2021-03-04 DIAGNOSIS — N3941 Urge incontinence: Secondary | ICD-10-CM | POA: Diagnosis not present

## 2021-03-04 LAB — POCT GLYCOSYLATED HEMOGLOBIN (HGB A1C): Hemoglobin A1C: 7.2 % — AB (ref 4.0–5.6)

## 2021-03-04 MED ORDER — FLUTICASONE-SALMETEROL 500-50 MCG/ACT IN AEPB: 1.0000 | INHALATION_SPRAY | Freq: Two times a day (BID) | RESPIRATORY_TRACT | 3 refills | Status: DC

## 2021-03-04 NOTE — Progress Notes (Signed)
Subjective:    Patient ID: Linda Garcia, female    DOB: Aug 18, 1934, 87 y.o.   MRN: [id number]  LUCCIA Garcia is a 87 y.o. female who presents for follow-up of Type 2 diabetes mellitus.  Home blood sugar records:  fasting lowest reading 113 highest reading 169 Current symptoms/problems include none and have been unchanged. Daily foot checks:yes  Any foot concerns: N/A Exercise:  staying active  she states that physical activity makes her asthma worse.  Apparently she is not using the rescue inhaler more than twice per week. Diet:fair She continues on metformin and pioglitazone and having no difficulty with that.  She is not using pantoprazole stating she is not having any reflux symptoms.  Continues on Lipitor without difficulty.  She also does have a history of aortic stenosis but only mild.  Does complain of difficulty with urge incontinence and is now using a pad intermittently. She does not exercise regularly.  She rarely uses her albuterol inhaler but does state that if she walks to her mailbox she can become short of breath. The following portions of the patient's history were reviewed and updated as appropriate: allergies, current medications, past medical history, past social history and problem list.  ROS as in subjective above.     Objective:    Physical Exam Alert and in no distress otherwise not examined.  Hemoglobin A1c is 7.2 Lab Review Diabetic Labs Latest Ref Rng & Units 10/30/2020 08/23/2020 06/27/2020 12/27/2019 11/14/2019  HbA1c 4.0 - 5.6 % 6.8(A) - 7.2(A) - -  Microalbumin mg/L - - - - -  Micro/Creat Ratio - - - - - -  Chol 100 - 199 mg/dL - 151 - 154 -  HDL >39 mg/dL - 64 - 56 -  Calc LDL 0 - 99 mg/dL - 77 - 85 -  Triglycerides 0 - 149 mg/dL - 46 - 66 -  Creatinine 0.57 - 1.00 mg/dL - 1.08(H) - - 1.12(H)   BP/Weight 12/06/2020 10/30/2020 08/23/2020 06/27/2020 93/71/6967  Systolic BP 893 810 175 102 585  Diastolic BP 72 88 90 74 92  Wt. (Lbs) 183 183  185.4 183.4 185  BMI 31.66 31.66 33.1 32.75 31.76   Foot/eye exam completion dates Latest Ref Rng & Units 12/27/2019 08/31/2019  Eye Exam No Retinopathy - No Retinopathy  Foot exam Order - - -  Foot Form Completion - Done -    Neera  reports that she has never smoked. She has never used smokeless tobacco. She reports that she does not drink alcohol and does not use drugs.     Assessment & Plan:    Type 2 diabetes mellitus with other circulatory complication, without long-term current use of insulin ([place]) - Plan: POCT glycosylated hemoglobin (Hb A1C)  Urge incontinence  Aortic valve stenosis, etiology of cardiac valve disease unspecified  Hypertension associated with diabetes (HCC)  Seasonal allergic rhinitis, unspecified trigger  Mild persistent asthma without complication - Plan: fluticasone-salmeterol (WIXELA INHUB) 500-50 MCG/ACT AEPB  Rx changes:  Increase Wixela 500/50 I explained that if she has to use a inhaler to go to her mailbox she is certainly not under adequate control.  Hopefully this will allow her to become more physically active. Education: Reviewed ABCs of diabetes management (respective goals in parentheses):  A1C (<7), blood pressure (<130/80), and cholesterol (LDL <100). Compliance at present is estimated to be fair. Efforts to improve compliance (if necessary) will be directed at increased exercise. Follow up: 4 months Discussed the urge incontinence with  her and at this point she is comfortable with that.

## 2021-04-11 DIAGNOSIS — E1159 Type 2 diabetes mellitus with other circulatory complications: Secondary | ICD-10-CM | POA: Diagnosis not present

## 2021-04-11 DIAGNOSIS — E119 Type 2 diabetes mellitus without complications: Secondary | ICD-10-CM | POA: Diagnosis not present

## 2021-05-10 ENCOUNTER — Other Ambulatory Visit: Payer: Self-pay | Admitting: Family Medicine

## 2021-05-10 DIAGNOSIS — E119 Type 2 diabetes mellitus without complications: Secondary | ICD-10-CM

## 2021-05-15 ENCOUNTER — Telehealth: Payer: Self-pay

## 2021-05-15 ENCOUNTER — Telehealth: Payer: Self-pay | Admitting: Family Medicine

## 2021-05-15 NOTE — Telephone Encounter (Signed)
Pt returned your call. Yes she does use [facility] for her supplies. She does not need any right now, she just recently received some supplies ?

## 2021-05-15 NOTE — Telephone Encounter (Signed)
Called pt to find out if she was getting her diabetes supplies from [facility]  and if she truly needed them now. No answer and waiting a call back. [place] ?

## 2021-05-16 NOTE — Telephone Encounter (Signed)
Waiting on [facility] sig to send in fax. [place] ?

## 2021-05-23 ENCOUNTER — Other Ambulatory Visit: Payer: Self-pay | Admitting: Family Medicine

## 2021-05-23 DIAGNOSIS — E119 Type 2 diabetes mellitus without complications: Secondary | ICD-10-CM

## 2021-07-01 NOTE — Progress Notes (Unsigned)
  Subjective:    Patient ID: Linda Garcia, female    DOB: Oct 03, 1934, 87 y.o.   MRN: [id number]  [name] is a 87 y.o. female who presents for follow-up of Type 2 diabetes mellitus.  Home blood sugar records: {diabetes glucometry results:16657} Current symptoms/problems include {Symptoms; diabetes:14075} and have been {Desc; course:15616}. Daily foot checks:   Any foot concerns: *** Exercise: {types:19826} Diet: The following portions of the patient's history were reviewed and updated as appropriate: allergies, current medications, past medical history, past social history and problem list.  ROS as in subjective above.     Objective:    Physical Exam Alert and in no distress otherwise not examined.  There were no vitals taken for this visit.  Lab Review    Latest Ref Rng & Units 03/04/2021    1:16 PM 10/30/2020   11:52 AM 08/23/2020    1:42 PM 06/27/2020   12:14 PM 12/27/2019   12:08 PM  Diabetic Labs  HbA1c 4.0 - 5.6 % 7.2   6.8    7.2     Chol 100 - 199 mg/dL   151    154    HDL >39 mg/dL   64    56    Calc LDL 0 - 99 mg/dL   77    85    Triglycerides 0 - 149 mg/dL   46    66    Creatinine 0.57 - 1.00 mg/dL   1.08          03/04/2021   11:38 AM 12/06/2020   11:25 AM 10/30/2020   11:37 AM 08/23/2020   11:55 AM 06/27/2020   11:19 AM  BP/Weight  Systolic BP 500 370 488 891 694  Diastolic BP 70 72 88 90 74  Wt. (Lbs) 180.4 183 183 185.4 183.4  BMI 31.21 kg/m2 31.66 kg/m2 31.66 kg/m2 33.1 kg/m2 32.75 kg/m2      Latest Ref Rng & Units 03/04/2021   11:45 AM 12/26/2020   12:00 AM  Foot/eye exam completion dates  Eye Exam No Retinopathy  No Retinopathy       Foot Form Completion  Done      This result is from an external source.    Jo-Anne  reports that she has never smoked. She has never used smokeless tobacco. She reports that she does not drink alcohol and does not use drugs.     Assessment & Plan:    No diagnosis found.  Rx changes:  {none:33079} Education: Reviewed 'ABCs' of diabetes management (respective goals in parentheses):  A1C (<7), blood pressure (<130/80), and cholesterol (LDL <100). Compliance at present is estimated to be {good/fair/poor:33178}. Efforts to improve compliance (if necessary) will be directed at {compliance:16716}. Follow up: {NUMBERS; 0-10:33138} {time:11}

## 2021-07-02 ENCOUNTER — Ambulatory Visit (INDEPENDENT_AMBULATORY_CARE_PROVIDER_SITE_OTHER): Payer: Medicare HMO | Admitting: Family Medicine

## 2021-07-02 VITALS — BP 138/70 | HR 101 | Temp 98.1°F | Wt 177.2 lb

## 2021-07-02 DIAGNOSIS — J4530 Mild persistent asthma, uncomplicated: Secondary | ICD-10-CM | POA: Diagnosis not present

## 2021-07-02 DIAGNOSIS — E1159 Type 2 diabetes mellitus with other circulatory complications: Secondary | ICD-10-CM

## 2021-07-02 DIAGNOSIS — E785 Hyperlipidemia, unspecified: Secondary | ICD-10-CM

## 2021-07-02 DIAGNOSIS — E1169 Type 2 diabetes mellitus with other specified complication: Secondary | ICD-10-CM

## 2021-07-02 DIAGNOSIS — I152 Hypertension secondary to endocrine disorders: Secondary | ICD-10-CM | POA: Diagnosis not present

## 2021-07-02 DIAGNOSIS — J302 Other seasonal allergic rhinitis: Secondary | ICD-10-CM | POA: Diagnosis not present

## 2021-07-02 LAB — POCT UA - MICROALBUMIN
Albumin/Creatinine Ratio, Urine, POC: 15.1
Creatinine, POC: 92.1 mg/dL
Microalbumin Ur, POC: 13.9 mg/L

## 2021-07-02 LAB — POCT GLYCOSYLATED HEMOGLOBIN (HGB A1C): Hemoglobin A1C: 6.5 % — AB (ref 4.0–5.6)

## 2021-07-11 DIAGNOSIS — E1159 Type 2 diabetes mellitus with other circulatory complications: Secondary | ICD-10-CM | POA: Diagnosis not present

## 2021-07-11 DIAGNOSIS — E119 Type 2 diabetes mellitus without complications: Secondary | ICD-10-CM | POA: Diagnosis not present

## 2021-07-29 ENCOUNTER — Other Ambulatory Visit: Payer: Self-pay | Admitting: Family Medicine

## 2021-07-29 DIAGNOSIS — E1169 Type 2 diabetes mellitus with other specified complication: Secondary | ICD-10-CM

## 2021-08-10 DIAGNOSIS — E1159 Type 2 diabetes mellitus with other circulatory complications: Secondary | ICD-10-CM | POA: Diagnosis not present

## 2021-08-10 DIAGNOSIS — E119 Type 2 diabetes mellitus without complications: Secondary | ICD-10-CM | POA: Diagnosis not present

## 2021-08-13 ENCOUNTER — Encounter: Payer: Self-pay | Admitting: Family Medicine

## 2021-08-19 IMAGING — CR DG CHEST 2V
2 series · 2 of 2 positions shown · non-contrast
Comparison: None.

CLINICAL DATA: Shortness of breath, systolic murmur, asthma

EXAM:
CHEST - 2 VIEW

[w chest pa]
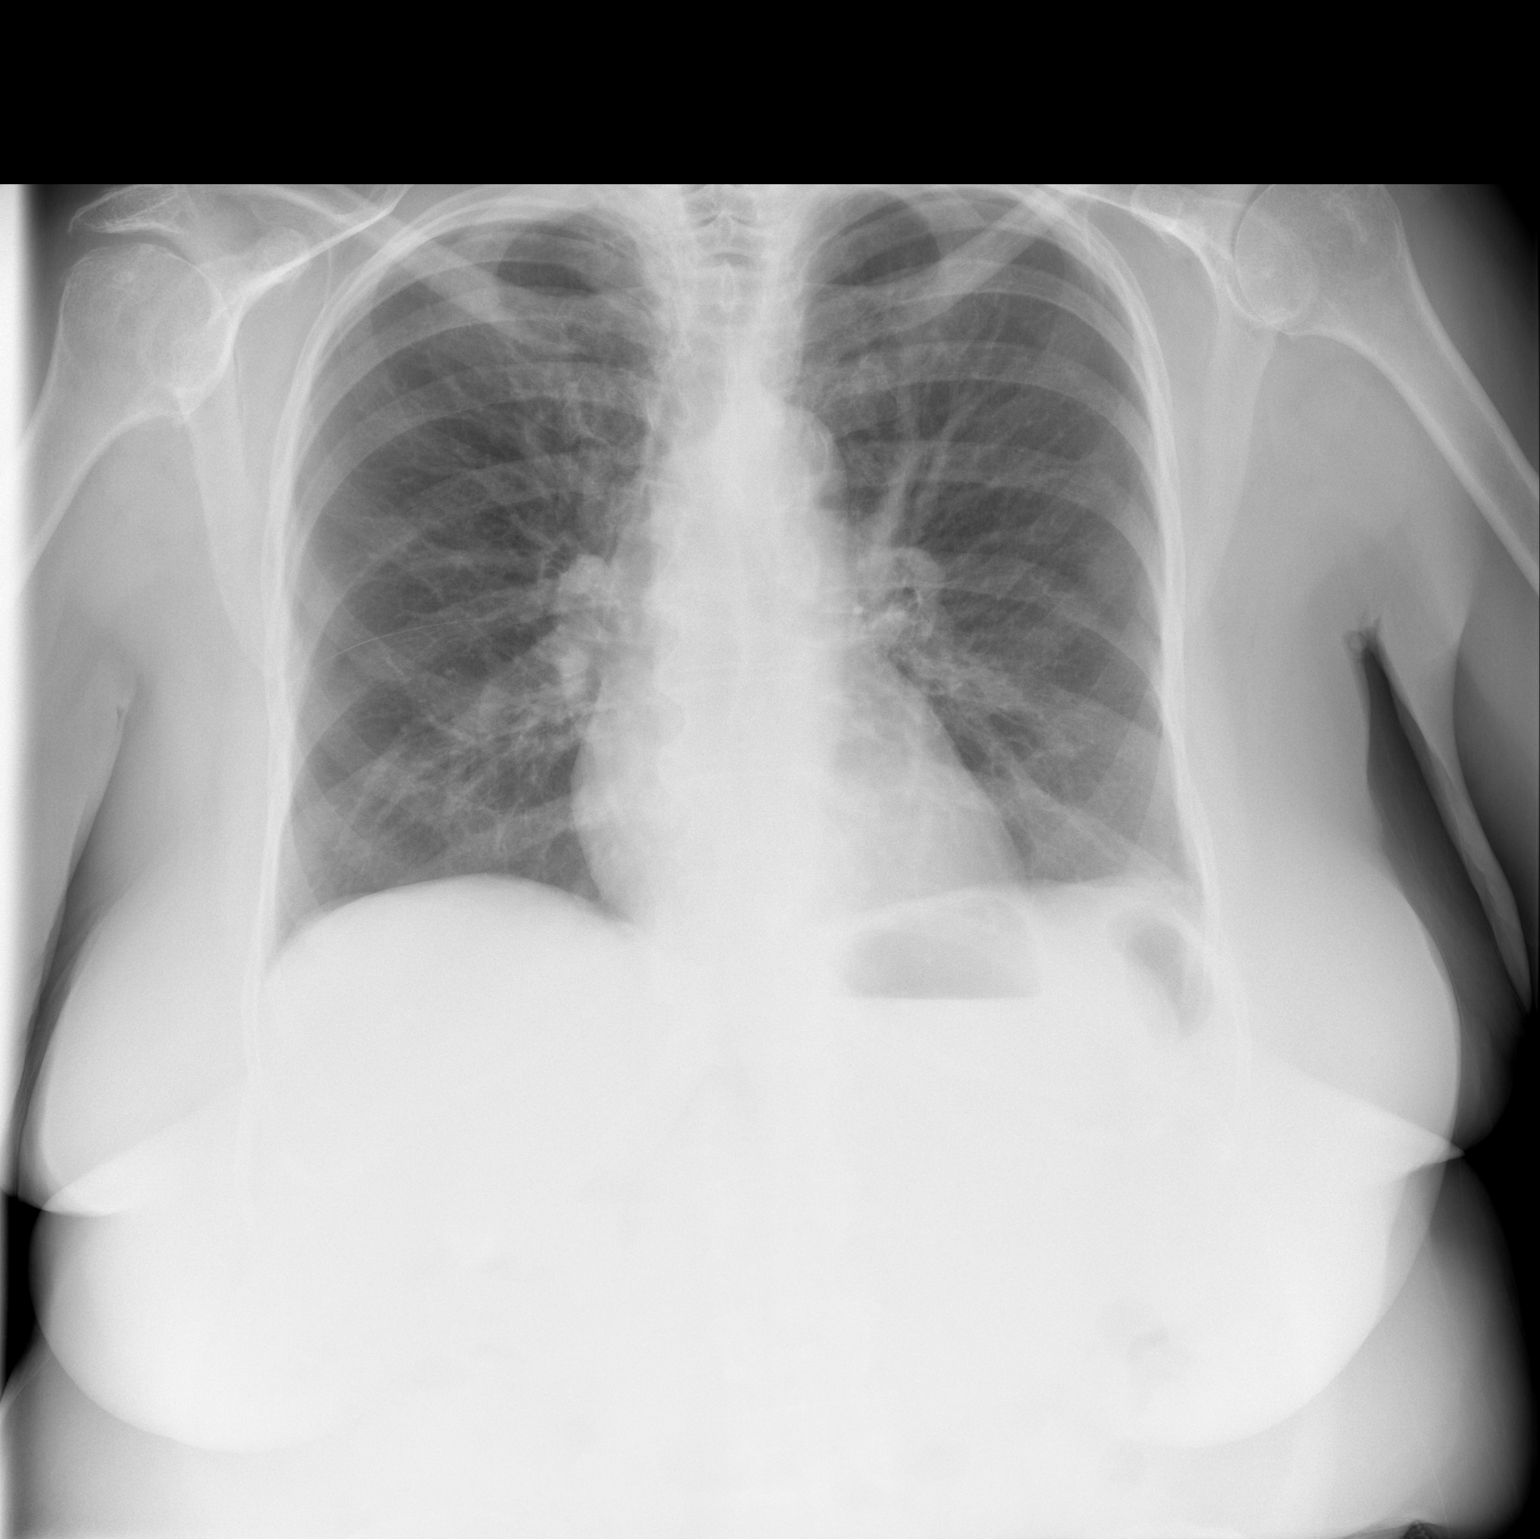

[w chest lat]
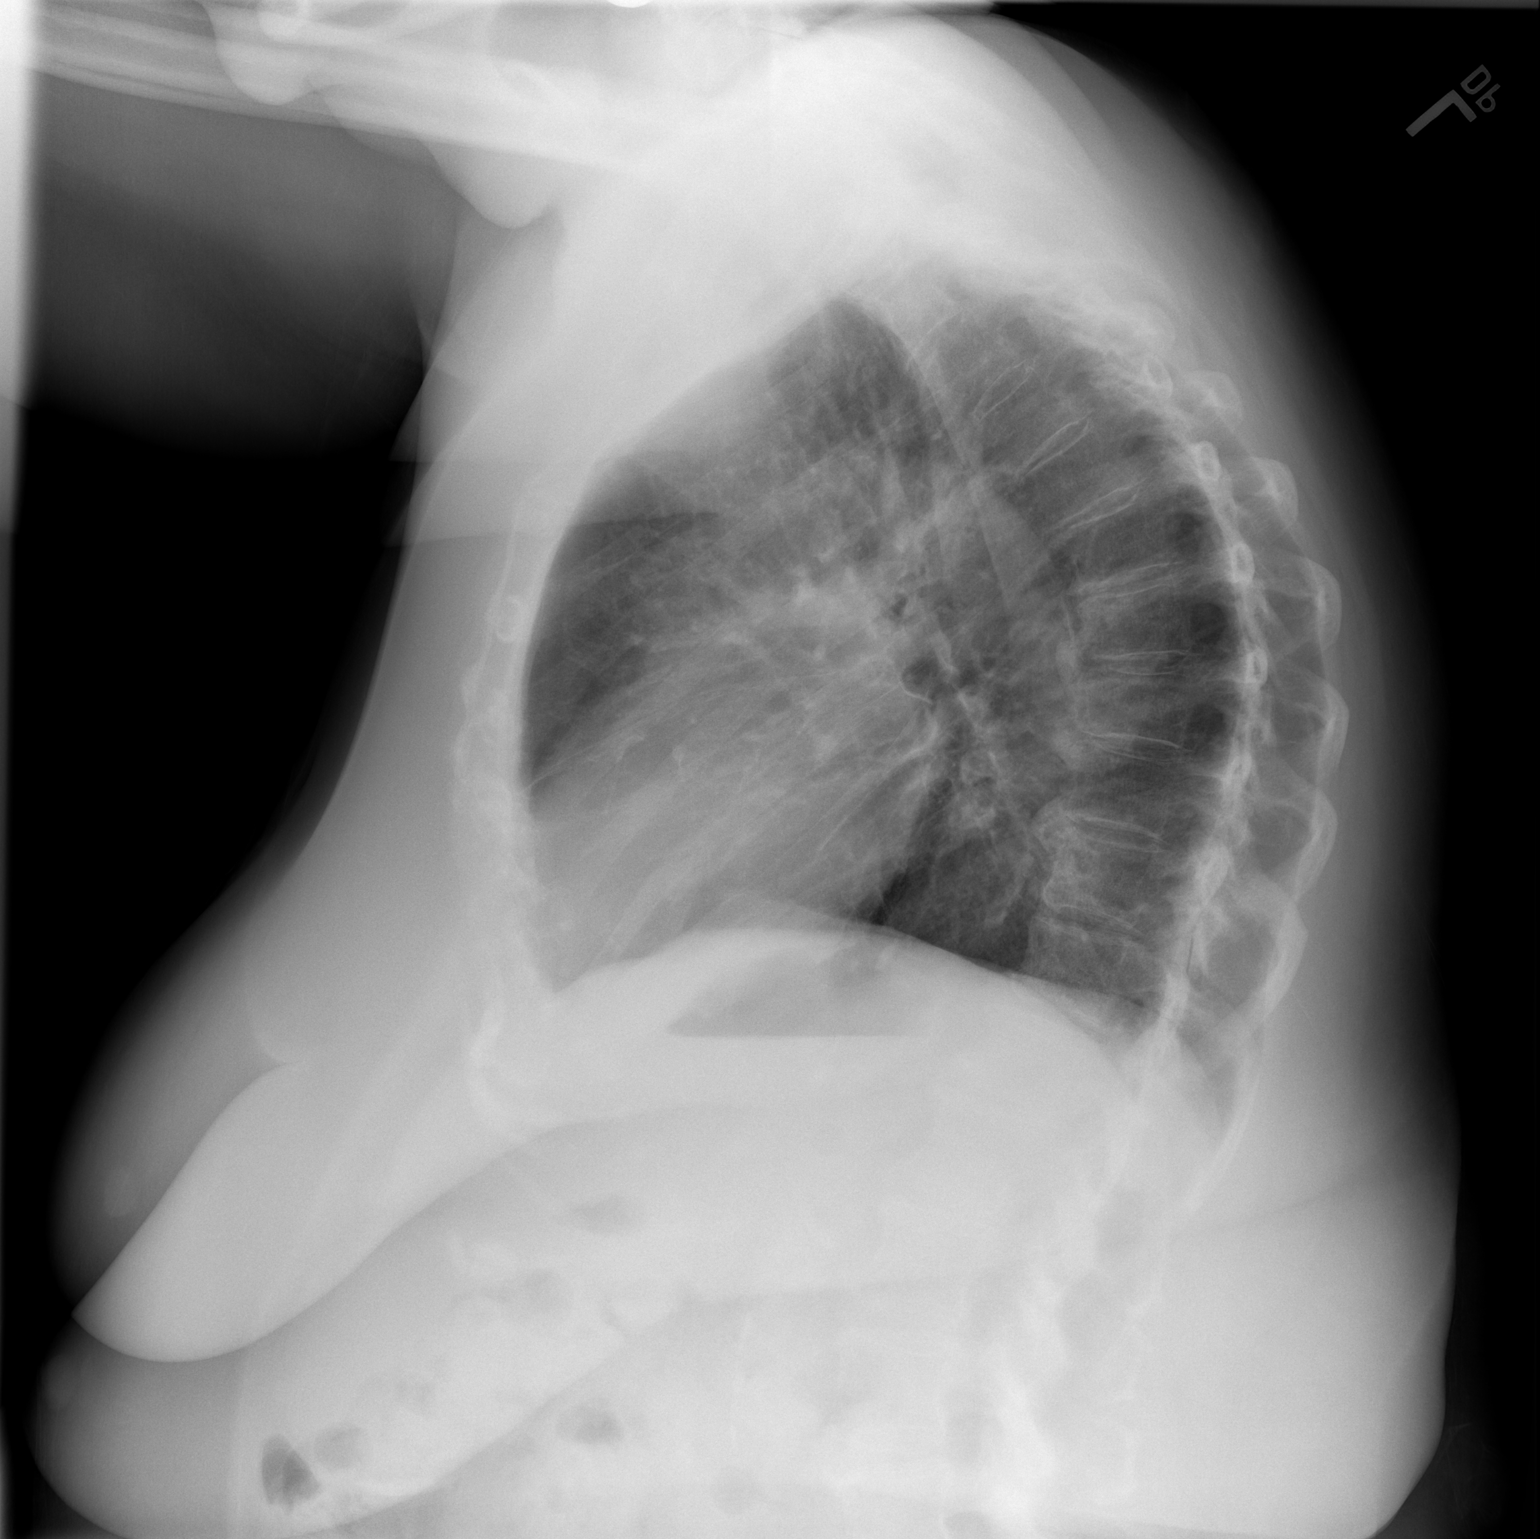

[2 of 2 positions shown; findings below may reference images not displayed]

FINDINGS: The heart size and mediastinal contours are within normal limits.
Atherosclerotic calcification of the aortic knob. No focal airspace
consolidation, pleural effusion, or pneumothorax. Exaggerated
thoracic kyphosis. Degenerative changes of the bilateral shoulders.
IMPRESSION: No active cardiopulmonary disease.

## 2021-08-21 DIAGNOSIS — E669 Obesity, unspecified: Secondary | ICD-10-CM | POA: Diagnosis not present

## 2021-08-21 DIAGNOSIS — J45909 Unspecified asthma, uncomplicated: Secondary | ICD-10-CM | POA: Diagnosis not present

## 2021-08-21 DIAGNOSIS — N3941 Urge incontinence: Secondary | ICD-10-CM | POA: Diagnosis not present

## 2021-08-21 DIAGNOSIS — I10 Essential (primary) hypertension: Secondary | ICD-10-CM | POA: Diagnosis not present

## 2021-08-21 DIAGNOSIS — Z6831 Body mass index (BMI) 31.0-31.9, adult: Secondary | ICD-10-CM | POA: Diagnosis not present

## 2021-08-21 DIAGNOSIS — Z8249 Family history of ischemic heart disease and other diseases of the circulatory system: Secondary | ICD-10-CM | POA: Diagnosis not present

## 2021-08-21 DIAGNOSIS — Z833 Family history of diabetes mellitus: Secondary | ICD-10-CM | POA: Diagnosis not present

## 2021-08-21 DIAGNOSIS — Z7951 Long term (current) use of inhaled steroids: Secondary | ICD-10-CM | POA: Diagnosis not present

## 2021-08-21 DIAGNOSIS — E785 Hyperlipidemia, unspecified: Secondary | ICD-10-CM | POA: Diagnosis not present

## 2021-08-21 DIAGNOSIS — Z809 Family history of malignant neoplasm, unspecified: Secondary | ICD-10-CM | POA: Diagnosis not present

## 2021-08-21 DIAGNOSIS — Z7984 Long term (current) use of oral hypoglycemic drugs: Secondary | ICD-10-CM | POA: Diagnosis not present

## 2021-08-21 DIAGNOSIS — Z008 Encounter for other general examination: Secondary | ICD-10-CM | POA: Diagnosis not present

## 2021-08-21 DIAGNOSIS — E1136 Type 2 diabetes mellitus with diabetic cataract: Secondary | ICD-10-CM | POA: Diagnosis not present

## 2021-08-30 DIAGNOSIS — Z1231 Encounter for screening mammogram for malignant neoplasm of breast: Secondary | ICD-10-CM | POA: Diagnosis not present

## 2021-08-30 LAB — HM MAMMOGRAPHY

## 2021-09-10 ENCOUNTER — Encounter: Payer: Self-pay | Admitting: Family Medicine

## 2021-10-03 DIAGNOSIS — J1189 Influenza due to unidentified influenza virus with other manifestations: Secondary | ICD-10-CM | POA: Diagnosis not present

## 2021-10-08 DIAGNOSIS — H2511 Age-related nuclear cataract, right eye: Secondary | ICD-10-CM | POA: Diagnosis not present

## 2021-10-08 DIAGNOSIS — H18413 Arcus senilis, bilateral: Secondary | ICD-10-CM | POA: Diagnosis not present

## 2021-10-08 DIAGNOSIS — H35373 Puckering of macula, bilateral: Secondary | ICD-10-CM | POA: Diagnosis not present

## 2021-10-08 DIAGNOSIS — H2513 Age-related nuclear cataract, bilateral: Secondary | ICD-10-CM | POA: Diagnosis not present

## 2021-10-08 DIAGNOSIS — H25013 Cortical age-related cataract, bilateral: Secondary | ICD-10-CM | POA: Diagnosis not present

## 2021-10-14 ENCOUNTER — Other Ambulatory Visit: Payer: Self-pay | Admitting: Family Medicine

## 2021-10-14 DIAGNOSIS — E1159 Type 2 diabetes mellitus with other circulatory complications: Secondary | ICD-10-CM | POA: Diagnosis not present

## 2021-10-14 DIAGNOSIS — E119 Type 2 diabetes mellitus without complications: Secondary | ICD-10-CM | POA: Diagnosis not present

## 2021-10-18 DIAGNOSIS — H2511 Age-related nuclear cataract, right eye: Secondary | ICD-10-CM | POA: Diagnosis not present

## 2021-10-18 DIAGNOSIS — H2512 Age-related nuclear cataract, left eye: Secondary | ICD-10-CM | POA: Diagnosis not present

## 2021-10-18 HISTORY — PX: CATARACT EXTRACTION: SUR2

## 2021-10-31 ENCOUNTER — Ambulatory Visit (INDEPENDENT_AMBULATORY_CARE_PROVIDER_SITE_OTHER): Payer: Medicare HMO

## 2021-10-31 VITALS — Ht 64.0 in | Wt 173.0 lb

## 2021-10-31 DIAGNOSIS — Z0000 Encounter for general adult medical examination without abnormal findings: Secondary | ICD-10-CM | POA: Diagnosis not present

## 2021-10-31 NOTE — Patient Instructions (Signed)
Linda Garcia , Thank you for taking time to come for your Medicare Wellness Visit. I appreciate your ongoing commitment to your health goals. Please review the following plan we discussed and let me know if I can assist you in the future.   Screening recommendations/referrals: Colonoscopy: not required Mammogram: completed 08/30/2021, due 09/01/2022 Bone Density: completed 11/26/2007 Recommended yearly ophthalmology/optometry visit for glaucoma screening and checkup Recommended yearly dental visit for hygiene and checkup  Vaccinations: Influenza vaccine: due Pneumococcal vaccine: completed 07/16/2016 Tdap vaccine: completed 11/09/2015, due 11/08/2025 Shingles vaccine: completed   Covid-19: 10/30/2020, 06/27/2020, 11/09/2019, 03/17/2019, 02/24/2019  Advanced directives: Advance directive discussed with you today.   Conditions/risks identified: none  Next appointment: Follow up in one year for your annual wellness visit    Preventive Care 65 Years and Older, Female Preventive care refers to lifestyle choices and visits with your health care provider that can promote health and wellness. What does preventive care include? A yearly physical exam. This is also called an annual well check. Dental exams once or twice a year. Routine eye exams. Ask your health care provider how often you should have your eyes checked. Personal lifestyle choices, including: Daily care of your teeth and gums. Regular physical activity. Eating a healthy diet. Avoiding tobacco and drug use. Limiting alcohol use. Practicing safe sex. Taking low-dose aspirin every day. Taking vitamin and mineral supplements as recommended by your health care provider. What happens during an annual well check? The services and screenings done by your health care provider during your annual well check will depend on your age, overall health, lifestyle risk factors, and family history of disease. Counseling  Your health care provider  may ask you questions about your: Alcohol use. Tobacco use. Drug use. Emotional well-being. Home and relationship well-being. Sexual activity. Eating habits. History of falls. Memory and ability to understand (cognition). Work and work Statistician. Reproductive health. Screening  You may have the following tests or measurements: Height, weight, and BMI. Blood pressure. Lipid and cholesterol levels. These may be checked every 5 years, or more frequently if you are over 46 years old. Skin check. Lung cancer screening. You may have this screening every year starting at age 57 if you have a 30-pack-year history of smoking and currently smoke or have quit within the past 15 years. Fecal occult blood test (FOBT) of the stool. You may have this test every year starting at age 87. Flexible sigmoidoscopy or colonoscopy. You may have a sigmoidoscopy every 5 years or a colonoscopy every 10 years starting at age 82. Hepatitis C blood test. Hepatitis B blood test. Sexually transmitted disease (STD) testing. Diabetes screening. This is done by checking your blood sugar (glucose) after you have not eaten for a while (fasting). You may have this done every 1-3 years. Bone density scan. This is done to screen for osteoporosis. You may have this done starting at age 2. Mammogram. This may be done every 1-2 years. Talk to your health care provider about how often you should have regular mammograms. Talk with your health care provider about your test results, treatment options, and if necessary, the need for more tests. Vaccines  Your health care provider may recommend certain vaccines, such as: Influenza vaccine. This is recommended every year. Tetanus, diphtheria, and acellular pertussis (Tdap, Td) vaccine. You may need a Td booster every 10 years. Zoster vaccine. You may need this after age 81. Pneumococcal 13-valent conjugate (PCV13) vaccine. One dose is recommended after age 41. Pneumococcal  polysaccharide (  PPSV23) vaccine. One dose is recommended after age 66. Talk to your health care provider about which screenings and vaccines you need and how often you need them. This information is not intended to replace advice given to you by your health care provider. Make sure you discuss any questions you have with your health care provider. Document Released: 02/16/2015 Document Revised: 10/10/2015 Document Reviewed: 11/21/2014 Elsevier Interactive Patient Education  2017 [place] Prevention in the Home Falls can cause injuries. They can happen to people of all ages. There are many things you can do to make your home safe and to help prevent falls. What can I do on the outside of my home? Regularly fix the edges of walkways and driveways and fix any cracks. Remove anything that might make you trip as you walk through a door, such as a raised step or threshold. Trim any bushes or trees on the path to your home. Use bright outdoor lighting. Clear any walking paths of anything that might make someone trip, such as rocks or tools. Regularly check to see if handrails are loose or broken. Make sure that both sides of any steps have handrails. Any raised decks and porches should have guardrails on the edges. Have any leaves, snow, or ice cleared regularly. Use sand or salt on walking paths during winter. Clean up any spills in your garage right away. This includes oil or grease spills. What can I do in the bathroom? Use night lights. Install grab bars by the toilet and in the tub and shower. Do not use towel bars as grab bars. Use non-skid mats or decals in the tub or shower. If you need to sit down in the shower, use a plastic, non-slip stool. Keep the floor dry. Clean up any water that spills on the floor as soon as it happens. Remove soap buildup in the tub or shower regularly. Attach bath mats securely with double-sided non-slip rug tape. Do not have throw rugs and other  things on the floor that can make you trip. What can I do in the bedroom? Use night lights. Make sure that you have a light by your bed that is easy to reach. Do not use any sheets or blankets that are too big for your bed. They should not hang down onto the floor. Have a firm chair that has side arms. You can use this for support while you get dressed. Do not have throw rugs and other things on the floor that can make you trip. What can I do in the kitchen? Clean up any spills right away. Avoid walking on wet floors. Keep items that you use a lot in easy-to-reach places. If you need to reach something above you, use a strong step stool that has a grab bar. Keep electrical cords out of the way. Do not use floor polish or wax that makes floors slippery. If you must use wax, use non-skid floor wax. Do not have throw rugs and other things on the floor that can make you trip. What can I do with my stairs? Do not leave any items on the stairs. Make sure that there are handrails on both sides of the stairs and use them. Fix handrails that are broken or loose. Make sure that handrails are as long as the stairways. Check any carpeting to make sure that it is firmly attached to the stairs. Fix any carpet that is loose or worn. Avoid having throw rugs at the top or bottom  of the stairs. If you do have throw rugs, attach them to the floor with carpet tape. Make sure that you have a light switch at the top of the stairs and the bottom of the stairs. If you do not have them, ask someone to add them for you. What else can I do to help prevent falls? Wear shoes that: Do not have high heels. Have rubber bottoms. Are comfortable and fit you well. Are closed at the toe. Do not wear sandals. If you use a stepladder: Make sure that it is fully opened. Do not climb a closed stepladder. Make sure that both sides of the stepladder are locked into place. Ask someone to hold it for you, if possible. Clearly  mark and make sure that you can see: Any grab bars or handrails. First and last steps. Where the edge of each step is. Use tools that help you move around (mobility aids) if they are needed. These include: Canes. Walkers. Scooters. Crutches. Turn on the lights when you go into a dark area. Replace any light bulbs as soon as they burn out. Set up your furniture so you have a clear path. Avoid moving your furniture around. If any of your floors are uneven, fix them. If there are any pets around you, be aware of where they are. Review your medicines with your doctor. Some medicines can make you feel dizzy. This can increase your chance of falling. Ask your doctor what other things that you can do to help prevent falls. This information is not intended to replace advice given to you by your health care provider. Make sure you discuss any questions you have with your health care provider. Document Released: 11/16/2008 Document Revised: 06/28/2015 Document Reviewed: 02/24/2014 Elsevier Interactive Patient Education  2017 [employer].

## 2021-10-31 NOTE — Progress Notes (Signed)
I connected with Linda Garcia today by telephone and verified that I am speaking with the correct person using two identifiers. Location patient: home Location provider: work Persons participating in the virtual visit: Linda Garcia, Linda Garcia.   I discussed the limitations, risks, security and privacy concerns of performing an evaluation and management service by telephone and the availability of in person appointments. I also discussed with the patient that there may be a patient responsible charge related to this service. The patient expressed understanding and verbally consented to this telephonic visit.    Interactive audio and video telecommunications were attempted between this provider and patient, however failed, due to patient having technical difficulties OR patient did not have access to video capability.  We continued and completed visit with audio only.     Vital signs may be patient reported or missing.  Subjective:   Linda Garcia is a 87 y.o. female who presents for Medicare Annual (Subsequent) preventive examination.  Review of Systems     Cardiac Risk Factors include: advanced age (>67mn, >>74women);diabetes mellitus;hypertension     Objective:    Today's Vitals   10/31/21 0811  Weight: 173 lb (78.5 kg)  Height: '5\' 4"'$  (1.626 m)   Body mass index is 29.7 kg/m.     10/31/2021    8:17 AM 10/30/2020   11:31 AM 04/06/2018   11:40 AM 07/16/2016   10:31 AM  Advanced Directives  Does Patient Have a Medical Advance Directive? No No No No  Would patient like information on creating a medical advance directive?  Yes (ED - Information included in AVS) Yes (MAU/Ambulatory/Procedural Areas - Information given) Yes (MAU/Ambulatory/Procedural Areas - Information given)    Current Medications (verified) Outpatient Encounter Medications as of 10/31/2021  Medication Sig   albuterol (PROAIR HFA) 108 (90 Base) MCG/ACT inhaler USE 2 INHALATIONS EVERY 6  HOURS AS NEEDED FOR WHEEZING OR SHORTNESS OF BREATH   amLODipine (NORVASC) 5 MG tablet Take 1 tablet (5 mg total) by mouth daily.   atorvastatin (LIPITOR) 10 MG tablet TAKE 1 TABLET DAILY   fexofenadine (ALLEGRA) 180 MG tablet Take 180 mg by mouth daily.   fish oil-omega-3 fatty acids 1000 MG capsule Take 2 g by mouth daily.   fluticasone-salmeterol (WIXELA INHUB) 500-50 MCG/ACT AEPB Inhale 1 puff into the lungs in the morning and at bedtime.   glucose blood (FREESTYLE LITE) test strip Use as instructed   Lancets (FREESTYLE) lancets Use as instructed   lisinopril-hydrochlorothiazide (ZESTORETIC) 20-12.5 MG tablet TAKE 1 TABLET DAILY   metFORMIN (GLUCOPHAGE) 850 MG tablet TAKE 1 TABLET TWICE A DAY WITH MEALS   Multiple Vitamins-Minerals (MULTIVITAMIN WITH MINERALS) tablet Take 1 tablet by mouth daily.   pioglitazone (ACTOS) 15 MG tablet TAKE 1 TABLET DAILY   amoxicillin (AMOXIL) 875 MG tablet Take 1 tablet (875 mg total) by mouth 2 (two) times daily. (Patient not taking: Reported on 12/27/2019)   aspirin 81 MG tablet Take 81 mg by mouth daily. (Patient not taking: Reported on 10/31/2021)   ondansetron (ZOFRAN ODT) 4 MG disintegrating tablet Take 1 tablet (4 mg total) by mouth every 8 (eight) hours as needed for nausea or vomiting. (Patient not taking: Reported on 06/27/2020)   pantoprazole (PROTONIX) 20 MG tablet Take 1 tablet (20 mg total) by mouth daily. (Patient not taking: Reported on 12/27/2019)   Facility-Administered Encounter Medications as of 10/31/2021  Medication   influenza  inactive virus vaccine (FLUZONE/FLUARIX) injection 0.5 mL    Allergies (verified) Patient  has no known allergies.   History: Past Medical History:  Diagnosis Date   Asthma    Diabetes mellitus    Diverticulosis    Dyslipidemia    Hypertension    Neuropathy in diabetes ([facility])    Small bowel obstruction (HCC)    Past Surgical History:  Procedure Laterality Date   ABDOMINAL HYSTERECTOMY     CATARACT  EXTRACTION Right 10/18/2021   COLONOSCOPY  2009   Family History  Problem Relation Age of Onset   Diabetes Mother    Hypertension Mother    Heart disease Father    Social History   Socioeconomic History   Marital status: Married    Spouse name: Not on file   Number of children: Not on file   Years of education: Not on file   Highest education level: Not on file  Occupational History   Not on file  Tobacco Use   Smoking status: Never   Smokeless tobacco: Never  Vaping Use   Vaping Use: Never used  Substance and Sexual Activity   Alcohol use: No   Drug use: No   Sexual activity: Never  Other Topics Concern   Not on file  Social History Narrative   Not on file   Social Determinants of Health   Financial Resource Strain: Low Risk  (10/31/2021)   Overall Financial Resource Strain (CARDIA)    Difficulty of Paying Living Expenses: Not hard at all  Food Insecurity: No Food Insecurity (10/31/2021)   Hunger Vital Sign    Worried About Running Out of Food in the Last Year: Never true    Ran Out of Food in the Last Year: Never true  Transportation Needs: No Transportation Needs (10/31/2021)   PRAPARE - Hydrologist (Medical): No    Lack of Transportation (Non-Medical): No  Physical Activity: Inactive (10/31/2021)   Exercise Vital Sign    Days of Exercise per Week: 0 days    Minutes of Exercise per Session: 0 min  Stress: No Stress Concern Present (10/31/2021)   [place]    Feeling of Stress : Not at all  Social Connections: Not on file    Tobacco Counseling Counseling given: Not Answered   Clinical Intake:  Pre-visit preparation completed: Yes  Pain : No/denies pain     Nutritional Status: BMI 25 -29 Overweight Nutritional Risks: None Diabetes: Yes  How often do you need to have someone help you when you read instructions, pamphlets, or other written materials from  your doctor or pharmacy?: 1 - Never What is the last grade level you completed in school?: 12th grade  Diabetic? Yes Nutrition Risk Assessment:  Has the patient had any N/V/D within the last 2 months?  No  Does the patient have any non-healing wounds?  No  Has the patient had any unintentional weight loss or weight gain?  No   Diabetes:  Is the patient diabetic?  Yes  If diabetic, was a CBG obtained today?  No  Did the patient bring in their glucometer from home?  No  How often do you monitor your CBG's? daily.   Financial Strains and Diabetes Management:  Are you having any financial strains with the device, your supplies or your medication? No .  Does the patient want to be seen by Chronic Care Management for management of their diabetes?  No  Would the patient like to be referred to a Nutritionist or for  Diabetic Management?  No   Diabetic Exams:  Diabetic Eye Exam: Completed 12/26/2020 Diabetic Foot Exam: Completed 03/04/2021   Interpreter Needed?: No  Information entered by :: NAllen Garcia   Activities of Daily Living    10/31/2021    8:20 AM  In your present state of health, do you have any difficulty performing the following activities:  Hearing? 0  Vision? 1  Comment cataract  Difficulty concentrating or making decisions? 0  Walking or climbing stairs? 0  Dressing or bathing? 0  Doing errands, shopping? 0  Preparing Food and eating ? N  Using the Toilet? N  In the past six months, have you accidently leaked urine? Y  Do you have problems with loss of bowel control? N  Managing your Medications? N  Managing your Finances? N  Housekeeping or managing your Housekeeping? N    Patient Care Team: Denita Lung, MD as PCP - General (Family Medicine)  Indicate any recent Medical Services you may have received from other than [facility] providers in the past year (date may be approximate).     Assessment:   This is a routine wellness examination for  Linda Garcia.  Hearing/Vision screen Vision Screening - Comments:: Regular eye exams, [facility], Dr. Clifton James  Dietary issues and exercise activities discussed: Current Exercise Habits: The patient does not participate in regular exercise at present   Goals Addressed             This Visit's Progress    Patient Stated       10/31/2021, exercise more       Depression Screen    10/31/2021    8:19 AM 10/30/2020   11:32 AM 12/27/2019   11:16 AM 04/06/2018   11:17 AM 12/01/2017    4:34 PM 12/02/2016   11:24 AM 07/16/2016   10:09 AM  PHQ 2/9 Scores  PHQ - 2 Score 0 0 0 0 0 0 0  PHQ- 9 Score 0          Fall Risk    10/31/2021    8:18 AM 10/30/2020   11:31 AM 12/27/2019   11:16 AM 04/06/2018   11:17 AM 12/24/2017   12:37 PM  Fall Risk   Falls in the past year? 1 0 0 0 0  Comment tripped over the curb    Emmi Telephone Survey: data to providers prior to load  Number falls in past yr: 0 0     Injury with Fall? 0 0     Risk for fall due to : Medication side effect No Fall Risks     Follow up Falls prevention discussed Falls evaluation completed       [place]:  Any stairs in or around the home? No  If so, are there any without handrails? N/a Home free of loose throw rugs in walkways, pet beds, electrical cords, etc? Yes  Adequate lighting in your home to reduce risk of falls? Yes   ASSISTIVE DEVICES UTILIZED TO PREVENT FALLS:  Life alert? Yes  Use of a cane, walker or w/c? No  Grab bars in the bathroom? No  Shower chair or bench in shower? No  Elevated toilet seat or a handicapped toilet? Yes   TIMED UP AND GO:  Was the test performed? No .      Cognitive Function:        10/31/2021    8:21 AM  6CIT Screen  What Year? 0 points  What month? 0 points  What time? 0 points  Count back from 20 0 points  Months in reverse 0 points  Repeat phrase 2 points  Total Score 2 points    Immunizations Immunization History   Administered Date(s) Administered   Fluad Quad(high Dose 65+) 10/29/2018, 11/09/2019, 10/30/2020   Influenza Split 11/01/2010, 10/27/2011   Influenza Whole 11/14/2009   Influenza, High Dose Seasonal PF 11/16/2013, 10/02/2014, 11/01/2015, 12/02/2016   Influenza,inj,Quad PF,6+ Mos 10/27/2012   Influenza-Unspecified 10/04/2017   Moderna Covid-19 Vaccine Bivalent Booster 19yr & up 10/30/2020   PFIZER Comirnaty(Gray Top)Covid-19 Tri-Sucrose Vaccine 06/27/2020   PFIZER(Purple Top)SARS-COV-2 Vaccination 02/24/2019, 03/17/2019, 11/09/2019   Pneumococcal Conjugate-13 07/16/2016   Pneumococcal Polysaccharide-23 02/04/2008, 07/15/2013   Td 06/11/2005   Tdap 11/09/2015   Zoster Recombinat (Shingrix) 10/01/2017, 12/02/2017   Zoster, Live 08/03/2012    TDAP status: Up to date  Flu Vaccine status: Due, Education has been provided regarding the importance of this vaccine. Advised may receive this vaccine at local pharmacy or Health Dept. Aware to provide a copy of the vaccination record if obtained from local pharmacy or Health Dept. Verbalized acceptance and understanding.  Pneumococcal vaccine status: Up to date  Covid-19 vaccine status: Completed vaccines  Qualifies for Shingles Vaccine? Yes   Zostavax completed Yes   Shingrix Completed?: Yes  Screening Tests Health Maintenance  Topic Date Due   COVID-19 Vaccine (6 - Pfizer series) 03/01/2021   Diabetic kidney evaluation - GFR measurement  08/23/2021   INFLUENZA VACCINE  09/03/2021   OPHTHALMOLOGY EXAM  12/26/2021   HEMOGLOBIN A1C  01/02/2022   FOOT EXAM  03/04/2022   Diabetic kidney evaluation - Urine ACR  07/03/2022   TETANUS/TDAP  11/08/2025   Pneumonia Vaccine 87 Years old  Completed   DEXA SCAN  Completed   Zoster Vaccines- Shingrix  Completed   HPV VACCINES  Aged Out    Health Maintenance  Health Maintenance Due  Topic Date Due   COVID-19 Vaccine (6 - Pfizer series) 03/01/2021   Diabetic kidney evaluation - GFR  measurement  08/23/2021   INFLUENZA VACCINE  09/03/2021    Colorectal cancer screening: No longer required.   Mammogram status: Completed 08/30/2021. Repeat every year  Bone Density status: Completed 11/26/2007.   Lung Cancer Screening: (Low Dose CT Chest recommended if Age [address]02-80years, 30 pack-year currently smoking OR have quit w/in 15years.) does not qualify.   Lung Cancer Screening Referral: no  Additional Screening:  Hepatitis C Screening: does not qualify;   Vision Screening: Recommended annual ophthalmology exams for early detection of glaucoma and other disorders of the eye. Is the patient up to date with their annual eye exam?  Yes  Who is the provider or what is the name of the office in which the patient attends annual eye exams? Dr. MClifton JamesIf pt is not established with a provider, would they like to be referred to a provider to establish care? No .   Dental Screening: Recommended annual dental exams for proper oral hygiene  Community Resource Referral / Chronic Care Management: CRR required this visit?  No   CCM required this visit?  No      Plan:     I have personally reviewed and noted the following in the patient's chart:   Medical and social history Use of alcohol, tobacco or illicit drugs  Current medications and supplements including opioid prescriptions. Patient is not currently taking opioid prescriptions. Functional ability and status Nutritional status Physical activity Advanced directives List  of other physicians Hospitalizations, surgeries, and ER visits in previous 12 months Vitals Screenings to include cognitive, depression, and falls Referrals and appointments  In addition, I have reviewed and discussed with patient certain preventive protocols, quality metrics, and best practice recommendations. A written personalized care plan for preventive services as well as general preventive health recommendations were provided to patient.      Kellie Simmering, Garcia   8/00/3491   Nurse Notes: none  Due to this being a virtual visit, the after visit summary with patients personalized plan was offered to patient via mail or my-chart.  to pick up at office at next visit

## 2021-11-01 ENCOUNTER — Ambulatory Visit: Payer: Medicare HMO | Admitting: Family Medicine

## 2021-11-01 DIAGNOSIS — H2512 Age-related nuclear cataract, left eye: Secondary | ICD-10-CM | POA: Diagnosis not present

## 2021-11-04 ENCOUNTER — Ambulatory Visit: Payer: Medicare HMO | Admitting: Family Medicine

## 2021-11-28 DIAGNOSIS — H524 Presbyopia: Secondary | ICD-10-CM | POA: Diagnosis not present

## 2021-11-28 DIAGNOSIS — H52223 Regular astigmatism, bilateral: Secondary | ICD-10-CM | POA: Diagnosis not present

## 2021-11-29 ENCOUNTER — Other Ambulatory Visit: Payer: Self-pay | Admitting: Family Medicine

## 2021-11-29 DIAGNOSIS — E119 Type 2 diabetes mellitus without complications: Secondary | ICD-10-CM

## 2021-12-17 ENCOUNTER — Other Ambulatory Visit (INDEPENDENT_AMBULATORY_CARE_PROVIDER_SITE_OTHER): Payer: Medicare HMO

## 2021-12-17 DIAGNOSIS — Z23 Encounter for immunization: Secondary | ICD-10-CM

## 2021-12-24 ENCOUNTER — Other Ambulatory Visit: Payer: Self-pay | Admitting: Family Medicine

## 2021-12-24 DIAGNOSIS — I152 Hypertension secondary to endocrine disorders: Secondary | ICD-10-CM

## 2021-12-31 ENCOUNTER — Ambulatory Visit (INDEPENDENT_AMBULATORY_CARE_PROVIDER_SITE_OTHER): Payer: Medicare HMO | Admitting: Family Medicine

## 2021-12-31 VITALS — BP 124/70 | HR 80 | Ht 63.0 in | Wt 181.0 lb

## 2021-12-31 DIAGNOSIS — Z7185 Encounter for immunization safety counseling: Secondary | ICD-10-CM

## 2021-12-31 DIAGNOSIS — J302 Other seasonal allergic rhinitis: Secondary | ICD-10-CM

## 2021-12-31 DIAGNOSIS — J4530 Mild persistent asthma, uncomplicated: Secondary | ICD-10-CM

## 2021-12-31 DIAGNOSIS — Z9849 Cataract extraction status, unspecified eye: Secondary | ICD-10-CM

## 2021-12-31 DIAGNOSIS — E119 Type 2 diabetes mellitus without complications: Secondary | ICD-10-CM

## 2021-12-31 DIAGNOSIS — E785 Hyperlipidemia, unspecified: Secondary | ICD-10-CM | POA: Diagnosis not present

## 2021-12-31 DIAGNOSIS — I700 Atherosclerosis of aorta: Secondary | ICD-10-CM

## 2021-12-31 DIAGNOSIS — N3941 Urge incontinence: Secondary | ICD-10-CM

## 2021-12-31 DIAGNOSIS — I152 Hypertension secondary to endocrine disorders: Secondary | ICD-10-CM

## 2021-12-31 DIAGNOSIS — I350 Nonrheumatic aortic (valve) stenosis: Secondary | ICD-10-CM | POA: Diagnosis not present

## 2021-12-31 DIAGNOSIS — E1169 Type 2 diabetes mellitus with other specified complication: Secondary | ICD-10-CM | POA: Diagnosis not present

## 2021-12-31 DIAGNOSIS — E1159 Type 2 diabetes mellitus with other circulatory complications: Secondary | ICD-10-CM | POA: Diagnosis not present

## 2021-12-31 LAB — POCT GLYCOSYLATED HEMOGLOBIN (HGB A1C): Hemoglobin A1C: 7.2 % — AB (ref 4.0–5.6)

## 2021-12-31 MED ORDER — METFORMIN HCL 850 MG PO TABS: 850.0000 mg | ORAL_TABLET | Freq: Two times a day (BID) | ORAL | 1 refills | Status: DC

## 2021-12-31 MED ORDER — PIOGLITAZONE HCL 15 MG PO TABS: 15.0000 mg | ORAL_TABLET | Freq: Every day | ORAL | 3 refills | Status: DC

## 2021-12-31 MED ORDER — LISINOPRIL-HYDROCHLOROTHIAZIDE 20-12.5 MG PO TABS: 1.0000 | ORAL_TABLET | Freq: Every day | ORAL | 1 refills | Status: DC

## 2021-12-31 MED ORDER — FLUTICASONE-SALMETEROL 500-50 MCG/ACT IN AEPB: 1.0000 | INHALATION_SPRAY | Freq: Two times a day (BID) | RESPIRATORY_TRACT | 3 refills | Status: DC

## 2021-12-31 MED ORDER — ATORVASTATIN CALCIUM 10 MG PO TABS: 10.0000 mg | ORAL_TABLET | Freq: Every day | ORAL | 3 refills | Status: DC

## 2021-12-31 NOTE — Progress Notes (Signed)
Subjective:    Patient ID: Linda Garcia, female    DOB: May 23, 1934, 87 y.o.   MRN: [id number]  Linda Garcia is a 87 y.o. female who presents for follow-up of Type 2 diabetes mellitus.  Home blood sugar records: fasting range: 115-162 Current symptoms/problems include  none  and have been stable. Daily foot checks:yes  Any foot concerns: none Exercise:  none due to cataract surgery in Sept Diet: regular diet She continues on amlodipine as well as lisinopril/HCTZ and is having no difficulty with that.  She continues on her metformin as well as pioglitazone.  Continues on [place] and rarely uses her albuterol inhaler.  She uses Protonix on an as-needed basis.  Her allergies are not causing any difficulty.  She does have urinary urgency and incontinence and does use pads.  She also has x-ray evidence of atherosclerosis of the aorta. The following portions of the patient's history were reviewed and updated as appropriate: allergies, current medications, past medical history, past social history and problem list.  ROS as in subjective above.     Objective:    Physical Exam Alert and in no distress cardiac exam does show a great 2/6 systolic murmur.  Lungs are clear to auscultation.  Foot exam is normal.  Hemoglobin A1c is 7.2 Lab Review    Latest Ref Rng & Units 07/02/2021   11:57 AM 03/04/2021    1:16 PM 10/30/2020   11:52 AM 08/23/2020    1:42 PM 06/27/2020   12:14 PM  Diabetic Labs  HbA1c 4.0 - 5.6 % 6.5  7.2  6.8   7.2   Microalbumin mg/L 13.9       Micro/Creat Ratio  15.1       Chol 100 - 199 mg/dL    151    HDL >39 mg/dL    64    Calc LDL 0 - 99 mg/dL    77    Triglycerides 0 - 149 mg/dL    46    Creatinine 0.57 - 1.00 mg/dL    1.08        10/31/2021    8:11 AM 07/02/2021   10:59 AM 03/04/2021   11:38 AM 12/06/2020   11:25 AM 10/30/2020   11:37 AM  BP/Weight  Systolic BP  027 253 664 403  Diastolic BP  70 70 72 88  Wt. (Lbs) 173 177.2 180.4 183 183  BMI 29.7  kg/m2 30.66 kg/m2 31.21 kg/m2 31.66 kg/m2 31.66 kg/m2      Latest Ref Rng & Units 03/04/2021   11:45 AM 12/26/2020   12:00 AM  Foot/eye exam completion dates  Eye Exam No Retinopathy  No Retinopathy      Foot Form Completion  Done      This result is from an external source.    Linda Garcia  reports that she has never smoked. She has never used smokeless tobacco. She reports that she does not drink alcohol and does not use drugs.     Assessment & Plan:    Type 2 diabetes mellitus with other circulatory complication, without long-term current use of insulin ([place]) - Plan: HgB A1c, CBC with Differential/Platelet, Comprehensive metabolic panel, Lipid panel, POCT UA - Microalbumin  Hypertension associated with diabetes ([place]) - Plan: CBC with Differential/Platelet, Comprehensive metabolic panel, lisinopril-hydrochlorothiazide (ZESTORETIC) 20-12.5 MG tablet  Seasonal allergic rhinitis, unspecified trigger  Mild persistent asthma without complication - Plan: fluticasone-salmeterol (WIXELA INHUB) 500-50 MCG/ACT AEPB  Hyperlipidemia associated with type 2 diabetes mellitus ([place]) -  Plan: Lipid panel, atorvastatin (LIPITOR) 10 MG tablet  Morbid obesity (HCC)  Vaccine counseling  Urge incontinence  Aortic valve stenosis, etiology of cardiac valve disease unspecified  Type 2 diabetes mellitus without complication, without long-term current use of insulin ([place]) - Plan: metFORMIN (GLUCOPHAGE) 850 MG tablet, pioglitazone (ACTOS) 15 MG tablet  History of cataract extraction, unspecified laterality  Discussed the urge incontinence with her in terms of using medication and at this point she is not interested in pursuing this.  Continue on present medication regimen.  Also recommend getting the RSV vaccine.

## 2022-01-01 LAB — COMPREHENSIVE METABOLIC PANEL
ALT: 12 IU/L (ref 0–32)
AST: 19 IU/L (ref 0–40)
Albumin/Globulin Ratio: 1.8 (ref 1.2–2.2)
Albumin: 4.3 g/dL (ref 3.7–4.7)
Alkaline Phosphatase: 90 IU/L (ref 44–121)
BUN/Creatinine Ratio: 27 (ref 12–28)
BUN: 31 mg/dL — ABNORMAL HIGH (ref 8–27)
Bilirubin Total: 0.2 mg/dL (ref 0.0–1.2)
CO2: 21 mmol/L (ref 20–29)
Calcium: 9.4 mg/dL (ref 8.7–10.3)
Chloride: 105 mmol/L (ref 96–106)
Creatinine, Ser: 1.13 mg/dL — ABNORMAL HIGH (ref 0.57–1.00)
Globulin, Total: 2.4 g/dL (ref 1.5–4.5)
Glucose: 143 mg/dL — ABNORMAL HIGH (ref 70–99)
Potassium: 4.2 mmol/L (ref 3.5–5.2)
Sodium: 141 mmol/L (ref 134–144)
Total Protein: 6.7 g/dL (ref 6.0–8.5)
eGFR: 48 mL/min/{1.73_m2} — ABNORMAL LOW (ref >59)

## 2022-01-01 LAB — CBC WITH DIFFERENTIAL/PLATELET
Basophils Absolute: 0 10*3/uL (ref 0.0–0.2)
Basos: 0 %
EOS (ABSOLUTE): 0.2 10*3/uL (ref 0.0–0.4)
Eos: 3 %
Hematocrit: 35.2 % (ref 34.0–46.6)
Hemoglobin: 11.2 g/dL (ref 11.1–15.9)
Immature Grans (Abs): 0 10*3/uL (ref 0.0–0.1)
Immature Granulocytes: 0 %
Lymphocytes Absolute: 1.4 10*3/uL (ref 0.7–3.1)
Lymphs: 26 %
MCH: 27.4 pg (ref 26.6–33.0)
MCHC: 31.8 g/dL (ref 31.5–35.7)
MCV: 86 fL (ref 79–97)
Monocytes Absolute: 0.4 10*3/uL (ref 0.1–0.9)
Monocytes: 7 %
Neutrophils Absolute: 3.3 10*3/uL (ref 1.4–7.0)
Neutrophils: 64 %
Platelets: 178 10*3/uL (ref 150–450)
RBC: 4.09 x10E6/uL (ref 3.77–5.28)
RDW: 14.4 % (ref 11.7–15.4)
WBC: 5.3 10*3/uL (ref 3.4–10.8)

## 2022-01-01 LAB — LIPID PANEL
Chol/HDL Ratio: 2.8 ratio (ref 0.0–4.4)
Cholesterol, Total: 164 mg/dL (ref 100–199)
HDL: 58 mg/dL (ref >39)
LDL Chol Calc (NIH): 94 mg/dL (ref 0–99)
Triglycerides: 63 mg/dL (ref 0–149)
VLDL Cholesterol Cal: 12 mg/dL (ref 5–40)

## 2022-01-13 DIAGNOSIS — E1159 Type 2 diabetes mellitus with other circulatory complications: Secondary | ICD-10-CM | POA: Diagnosis not present

## 2022-01-28 ENCOUNTER — Ambulatory Visit: Payer: Medicare HMO | Admitting: Family Medicine

## 2022-02-12 DIAGNOSIS — E1159 Type 2 diabetes mellitus with other circulatory complications: Secondary | ICD-10-CM | POA: Diagnosis not present

## 2022-03-17 ENCOUNTER — Other Ambulatory Visit: Payer: Self-pay | Admitting: Family Medicine

## 2022-03-17 DIAGNOSIS — E1159 Type 2 diabetes mellitus with other circulatory complications: Secondary | ICD-10-CM

## 2022-03-17 DIAGNOSIS — I152 Hypertension secondary to endocrine disorders: Secondary | ICD-10-CM

## 2022-04-14 DIAGNOSIS — E119 Type 2 diabetes mellitus without complications: Secondary | ICD-10-CM | POA: Diagnosis not present

## 2022-05-07 DIAGNOSIS — Z961 Presence of intraocular lens: Secondary | ICD-10-CM | POA: Diagnosis not present

## 2022-05-07 DIAGNOSIS — H43393 Other vitreous opacities, bilateral: Secondary | ICD-10-CM | POA: Diagnosis not present

## 2022-05-15 ENCOUNTER — Ambulatory Visit: Payer: Self-pay

## 2022-05-15 NOTE — Patient Instructions (Signed)
Visit Information  Thank you for taking time to visit with me today. Please don't hesitate to contact me if I can be of assistance to you.   Following are the goals we discussed today:   Goals Addressed             This Visit's Progress    Care Coordinated Activities       Care Coordination Interventions: SDoH screening performed - no acute resource challenges identified at this time Determined the patient does not have concerns with medication costs and/or adherence Education provided on the role of the care coordination team - patient declines follow up at this time Encouraged the patient to contact her primary care provider as needed         If you are experiencing a Mental Health or Behavioral Health Crisis or need someone to talk to, please call 911  The patient verbalized understanding of instructions, educational materials, and care plan provided today and DECLINED offer to receive copy of patient instructions, educational materials, and care plan.   No further follow up required: Please contact your primary care provider as needed.  Bevelyn Ngo, BSW, CDP Social Worker, Certified Dementia Practitioner [facility] Care Management  Care Coordination [phone]

## 2022-05-15 NOTE — Patient Outreach (Signed)
  Care Coordination   Initial Visit Note   05/15/2022 Name: Linda Garcia MRN: [id number] DOB: [birth year]  Linda Garcia is a 88 y.o. year old female who sees Ronnald Nian, MD for primary care. I spoke with  Linda Garcia by phone today.  What matters to the patients health and wellness today?  No concerns at this time    Goals Addressed             This Visit's Progress    Care Coordinated Activities       Care Coordination Interventions: SDoH screening performed - no acute resource challenges identified at this time Determined the patient does not have concerns with medication costs and/or adherence Education provided on the role of the care coordination team - patient declines follow up at this time Encouraged the patient to contact her primary care provider as needed         SDOH assessments and interventions completed:  Yes  SDOH Interventions Today    Flowsheet Row Most Recent Value  SDOH Interventions   Food Insecurity Interventions Intervention Not Indicated  Housing Interventions Intervention Not Indicated  Transportation Interventions Intervention Not Indicated  Utilities Interventions Intervention Not Indicated        Care Coordination Interventions:  Yes, provided   Interventions Today    Flowsheet Row Most Recent Value  Chronic Disease   Chronic disease during today's visit Diabetes, Hypertension (HTN)  General Interventions   General Interventions Discussed/Reviewed General Interventions Discussed, Doctor Visits  Doctor Visits Discussed/Reviewed Doctor Visits Reviewed  Education Interventions   Education Provided Provided Education  Provided Verbal Education On Other  [role of Care Coordination team]        Follow up plan: No further intervention required.   Encounter Outcome:  Pt. Visit Completed   Bevelyn Ngo, BSW, CDP Social Worker, Certified Dementia Practitioner [facility] Care Management  Care  Coordination [phone]

## 2022-06-26 ENCOUNTER — Telehealth: Payer: Self-pay | Admitting: Family Medicine

## 2022-06-26 DIAGNOSIS — J4530 Mild persistent asthma, uncomplicated: Secondary | ICD-10-CM

## 2022-06-26 DIAGNOSIS — E1159 Type 2 diabetes mellitus with other circulatory complications: Secondary | ICD-10-CM

## 2022-06-26 MED ORDER — LISINOPRIL-HYDROCHLOROTHIAZIDE 20-12.5 MG PO TABS: 1.0000 | ORAL_TABLET | Freq: Every day | ORAL | 0 refills | Status: DC

## 2022-06-26 MED ORDER — ALBUTEROL SULFATE HFA 108 (90 BASE) MCG/ACT IN AERS
INHALATION_SPRAY | RESPIRATORY_TRACT | 1 refills | Status: DC
Start: 2022-06-26 — End: 2023-07-15

## 2022-06-26 NOTE — Addendum Note (Signed)
Addended by: Benjiman Core on: 06/26/2022 05:47 PM   Modules accepted: Orders

## 2022-06-26 NOTE — Telephone Encounter (Signed)
Pt called and is requesting a refill for her lisionpril And her albuterol Please send to the  [facility] - [place], [place] - [address]

## 2022-07-21 DIAGNOSIS — E1159 Type 2 diabetes mellitus with other circulatory complications: Secondary | ICD-10-CM | POA: Diagnosis not present

## 2022-07-24 ENCOUNTER — Encounter: Payer: Self-pay | Admitting: Family Medicine

## 2022-07-24 ENCOUNTER — Ambulatory Visit (INDEPENDENT_AMBULATORY_CARE_PROVIDER_SITE_OTHER): Payer: Medicare HMO | Admitting: Family Medicine

## 2022-07-24 VITALS — BP 140/74 | HR 91 | Temp 98.0°F | Resp 18 | Ht 63.0 in | Wt 174.8 lb

## 2022-07-24 DIAGNOSIS — I350 Nonrheumatic aortic (valve) stenosis: Secondary | ICD-10-CM | POA: Diagnosis not present

## 2022-07-24 DIAGNOSIS — N1832 Chronic kidney disease, stage 3b: Secondary | ICD-10-CM | POA: Diagnosis not present

## 2022-07-24 DIAGNOSIS — J4530 Mild persistent asthma, uncomplicated: Secondary | ICD-10-CM

## 2022-07-24 DIAGNOSIS — E119 Type 2 diabetes mellitus without complications: Secondary | ICD-10-CM | POA: Diagnosis not present

## 2022-07-24 DIAGNOSIS — E1159 Type 2 diabetes mellitus with other circulatory complications: Secondary | ICD-10-CM | POA: Diagnosis not present

## 2022-07-24 DIAGNOSIS — I700 Atherosclerosis of aorta: Secondary | ICD-10-CM

## 2022-07-24 DIAGNOSIS — Z0000 Encounter for general adult medical examination without abnormal findings: Secondary | ICD-10-CM | POA: Diagnosis not present

## 2022-07-24 DIAGNOSIS — I152 Hypertension secondary to endocrine disorders: Secondary | ICD-10-CM

## 2022-07-24 DIAGNOSIS — E1169 Type 2 diabetes mellitus with other specified complication: Secondary | ICD-10-CM | POA: Diagnosis not present

## 2022-07-24 DIAGNOSIS — R609 Edema, unspecified: Secondary | ICD-10-CM | POA: Diagnosis not present

## 2022-07-24 DIAGNOSIS — N3281 Overactive bladder: Secondary | ICD-10-CM

## 2022-07-24 DIAGNOSIS — Z7185 Encounter for immunization safety counseling: Secondary | ICD-10-CM

## 2022-07-24 DIAGNOSIS — J302 Other seasonal allergic rhinitis: Secondary | ICD-10-CM

## 2022-07-24 DIAGNOSIS — E785 Hyperlipidemia, unspecified: Secondary | ICD-10-CM

## 2022-07-24 LAB — COMPREHENSIVE METABOLIC PANEL
ALT: 17 IU/L (ref 0–32)
AST: 24 IU/L (ref 0–40)
Albumin: 4.4 g/dL (ref 3.7–4.7)
Alkaline Phosphatase: 86 IU/L (ref 44–121)
BUN/Creatinine Ratio: 28 (ref 12–28)
BUN: 37 mg/dL — ABNORMAL HIGH (ref 8–27)
Bilirubin Total: 0.3 mg/dL (ref 0.0–1.2)
CO2: 22 mmol/L (ref 20–29)
Calcium: 9.8 mg/dL (ref 8.7–10.3)
Chloride: 102 mmol/L (ref 96–106)
Creatinine, Ser: 1.34 mg/dL — ABNORMAL HIGH (ref 0.57–1.00)
Globulin, Total: 2.4 g/dL (ref 1.5–4.5)
Glucose: 153 mg/dL — ABNORMAL HIGH (ref 70–99)
Potassium: 4.3 mmol/L (ref 3.5–5.2)
Sodium: 138 mmol/L (ref 134–144)
Total Protein: 6.8 g/dL (ref 6.0–8.5)
eGFR: 39 mL/min/{1.73_m2} — ABNORMAL LOW (ref >59)

## 2022-07-24 LAB — CBC WITH DIFFERENTIAL/PLATELET
Basophils Absolute: 0 10*3/uL (ref 0.0–0.2)
Basos: 0 %
EOS (ABSOLUTE): 0.1 10*3/uL (ref 0.0–0.4)
Eos: 2 %
Hematocrit: 35.9 % (ref 34.0–46.6)
Hemoglobin: 11.5 g/dL (ref 11.1–15.9)
Immature Grans (Abs): 0 10*3/uL (ref 0.0–0.1)
Immature Granulocytes: 0 %
Lymphocytes Absolute: 1 10*3/uL (ref 0.7–3.1)
Lymphs: 19 %
MCH: 27.6 pg (ref 26.6–33.0)
MCHC: 32 g/dL (ref 31.5–35.7)
MCV: 86 fL (ref 79–97)
Monocytes Absolute: 0.4 10*3/uL (ref 0.1–0.9)
Monocytes: 8 %
Neutrophils Absolute: 3.9 10*3/uL (ref 1.4–7.0)
Neutrophils: 71 %
Platelets: 184 10*3/uL (ref 150–450)
RBC: 4.17 x10E6/uL (ref 3.77–5.28)
RDW: 14.2 % (ref 11.7–15.4)
WBC: 5.5 10*3/uL (ref 3.4–10.8)

## 2022-07-24 LAB — LIPID PANEL
Chol/HDL Ratio: 2.7 ratio (ref 0.0–4.4)
Cholesterol, Total: 156 mg/dL (ref 100–199)
HDL: 58 mg/dL (ref >39)
LDL Chol Calc (NIH): 87 mg/dL (ref 0–99)
Triglycerides: 51 mg/dL (ref 0–149)
VLDL Cholesterol Cal: 11 mg/dL (ref 5–40)

## 2022-07-24 LAB — POCT GLYCOSYLATED HEMOGLOBIN (HGB A1C): Hemoglobin A1C: 7.4 % — AB (ref 4.0–5.6)

## 2022-07-24 MED ORDER — AMLODIPINE BESYLATE 5 MG PO TABS: 5.0000 mg | ORAL_TABLET | Freq: Every day | ORAL | 1 refills | Status: DC

## 2022-07-24 MED ORDER — PIOGLITAZONE HCL 15 MG PO TABS: 15.0000 mg | ORAL_TABLET | Freq: Every day | ORAL | 3 refills | Status: DC

## 2022-07-24 MED ORDER — LISINOPRIL-HYDROCHLOROTHIAZIDE 20-12.5 MG PO TABS: 1.0000 | ORAL_TABLET | Freq: Every day | ORAL | 0 refills | Status: DC

## 2022-07-24 MED ORDER — ATORVASTATIN CALCIUM 10 MG PO TABS: 10.0000 mg | ORAL_TABLET | Freq: Every day | ORAL | 3 refills | Status: DC

## 2022-07-24 MED ORDER — OXYBUTYNIN CHLORIDE ER 5 MG PO TB24
5.0000 mg | ORAL_TABLET | Freq: Every day | ORAL | 1 refills | Status: DC
Start: 2022-07-24 — End: 2023-02-18

## 2022-07-24 MED ORDER — FLUTICASONE-SALMETEROL 500-50 MCG/ACT IN AEPB: 1.0000 | INHALATION_SPRAY | Freq: Two times a day (BID) | RESPIRATORY_TRACT | 3 refills | Status: DC

## 2022-07-24 MED ORDER — METFORMIN HCL 850 MG PO TABS: 850.0000 mg | ORAL_TABLET | Freq: Two times a day (BID) | ORAL | 1 refills | Status: DC

## 2022-07-24 NOTE — Patient Instructions (Signed)
  Linda Garcia , Thank you for taking time to come for your Medicare Wellness Visit. I appreciate your ongoing commitment to your health goals. Please review the following plan we discussed and let me know if I can assist you in the future.   These are the goals we discussed:  Goals      Care Coordinated Activities     Care Coordination Interventions: SDoH screening performed - no acute resource challenges identified at this time Determined the patient does not have concerns with medication costs and/or adherence Education provided on the role of the care coordination team - patient declines follow up at this time Encouraged the patient to contact her primary care provider as needed      Patient Stated     10/31/2021, exercise more        This is a list of the screening recommended for you and due dates:  Health Maintenance  Topic Date Due   Eye exam for diabetics  12/26/2021   Yearly kidney health urinalysis for diabetes  07/03/2022   Hemoglobin A1C  07/01/2022   Flu Shot  09/04/2022   Yearly kidney function blood test for diabetes  01/01/2023   Complete foot exam   01/01/2023   Medicare Annual Wellness Visit  07/24/2023   DTaP/Tdap/Td vaccine (3 - Td or Tdap) 11/08/2025   Pneumonia Vaccine  Completed   DEXA scan (bone density measurement)  Completed   COVID-19 Vaccine  Completed   Zoster (Shingles) Vaccine  Completed   HPV Vaccine  Aged Out

## 2022-07-24 NOTE — Progress Notes (Signed)
Linda Garcia is a 88 y.o. female who presents for annual wellness visit,CPE and follow-up on chronic medical conditions.  She has no particular concerns or complaints. She continues on lisinopril/HCTZ and amlodipine and having no difficulty with that.  She does have a history of aortic atherosclerosis and also AS.  No chest pain, shortness of breath, syncopal episodes.  Her last echo did show a gradient of 22.  She continues on her asthma medicine and only rarely uses the albuterol.  She continues on metformin and pioglitazone.  She has had an eye exam and does check her feet regularly.  Her activity level is quite limited.  Allergies seem to be under good control.  She does complain of overactive bladder symptoms. Immunizations and Health Maintenance Immunization History  Administered Date(s) Administered   COVID-19, mRNA, vaccine(Comirnaty)12 years and older 12/17/2021   Fluad Quad(high Dose 65+) 10/29/2018, 11/09/2019, 10/30/2020, 12/17/2021   Influenza Split 11/01/2010, 10/27/2011   Influenza Whole 11/14/2009   Influenza, High Dose Seasonal PF 11/16/2013, 10/02/2014, 11/01/2015, 12/02/2016   Influenza,inj,Quad PF,6+ Mos 10/27/2012   Influenza-Unspecified 10/04/2017   Moderna Covid-19 Vaccine Bivalent Booster 29yrs & up 10/30/2020   PFIZER Comirnaty(Gray Top)Covid-19 Tri-Sucrose Vaccine 06/27/2020   PFIZER(Purple Top)SARS-COV-2 Vaccination 02/24/2019, 03/17/2019, 11/09/2019   Pneumococcal Conjugate-13 07/16/2016   Pneumococcal Polysaccharide-23 02/04/2008, 07/15/2013   Td 06/11/2005   Tdap 11/09/2015   Zoster Recombinat (Shingrix) 10/01/2017, 12/02/2017   Zoster, Live 08/03/2012   Health Maintenance Due  Topic Date Due   OPHTHALMOLOGY EXAM  12/26/2021   Diabetic kidney evaluation - Urine ACR  07/03/2022   HEMOGLOBIN A1C  07/01/2022    Last Pap smear: n/a Last mammogram:08/2021 Last colonoscopy: 2015 Last DEXA: 2009 Dentist:within the last year Ophtho:within the last  year Exercise: no exercise  Other doctors caring for patient include:[place]  Advanced directives:  No bring info in  Depression screen:  See questionnaire below.     07/24/2022   10:31 AM 10/31/2021    8:19 AM 10/30/2020   11:32 AM 12/27/2019   11:16 AM 04/06/2018   11:17 AM  Depression screen PHQ 2/9  Decreased Interest 0 0 0 0 0  Down, Depressed, Hopeless 0 0 0 0 0  PHQ - 2 Score 0 0 0 0 0  Altered sleeping  0     Tired, decreased energy  0     Change in appetite  0     Feeling bad or failure about yourself   0     Trouble concentrating  0     Moving slowly or fidgety/restless  0     Suicidal thoughts  0     PHQ-9 Score  0     Difficult doing work/chores  Not difficult at all       Fall Risk Screen: see questionnaire below.    07/24/2022   10:31 AM 07/24/2022   10:18 AM 10/31/2021    8:18 AM 10/30/2020   11:31 AM 12/27/2019   11:16 AM  Fall Risk   Falls in the past year? 0 0 1 0 0  Comment   tripped over the curb    Number falls in past yr: 0 0 0 0   Injury with Fall? 0 0 0 0   Risk for fall due to : No Fall Risks No Fall Risks Medication side effect No Fall Risks   Follow up Falls evaluation completed Falls evaluation completed Falls prevention discussed Falls evaluation completed     ADL screen:  See questionnaire  below Functional Status Survey: Is the patient deaf or have difficulty hearing?: No Does the patient have difficulty seeing, even when wearing glasses/contacts?: No Does the patient have difficulty concentrating, remembering, or making decisions?: No Does the patient have difficulty walking or climbing stairs?: Yes Does the patient have difficulty dressing or bathing?: No Does the patient have difficulty doing errands alone such as visiting a doctor's office or shopping?: No   Review of Systems Constitutional: -, -unexpected weight change, -anorexia, -fatigue Allergy: -sneezing, -itching, -congestion Dermatology: denies changing moles, rash,  lumps ENT: -runny nose, -ear pain, -sore throat,  Cardiology:  -chest pain, -palpitations, -orthopnea, Respiratory: -cough, -shortness of breath, -dyspnea on exertion, -wheezing,  Gastroenterology: -abdominal pain, -nausea, -vomiting, -diarrhea, -constipation, -dysphagia Hematology: -bleeding or bruising problems Musculoskeletal: -arthralgias, -myalgias, -joint swelling, -back pain, - Ophthalmology: -vision changes,  Urology: -dysuria, -difficulty urinating,  -urinary frequency, -urgency, incontinence Neurology: -, -numbness, , -memory loss, -falls, -dizziness    PHYSICAL EXAM:  BP (!) 140/74 (BP Location: Left Arm, Cuff Size: Large)   Pulse 91   Temp 98 F (36.7 C) (Oral)   Resp 18   Ht 5\' 3"  (1.6 m)   Wt 174 lb 12.8 oz (79.3 kg)   SpO2 98% Comment: room air  BMI 30.96 kg/m   General Appearance: Alert, cooperative, no distress, appears stated age Head: Normocephalic, without obvious abnormality, atraumatic Eyes: PERRL, conjunctiva/corneas clear, EOM's intact,  Ears: Normal TM's and external ear canals Nose: Nares normal, mucosa normal, no drainage or sinus tenderness Throat: Lips, mucosa, and tongue normal; teeth and gums normal Neck: Supple, no lymphadenopathy;  thyroid:  no enlargement/tenderness/nodules; no carotid bruit or JVD Lungs: Clear to auscultation bilaterally without wheezes, rales or ronchi; respirations unlabored Heart: Regular rate and rhythm, S1 and S2 normal, 3/6 SEM Abdomen: Soft, non-tender, nondistended, normoactive bowel sounds,  no masses, no hepatosplenomegaly Skin:  Skin color, texture, turgor normal, no rashes or lesions Lymph nodes: Cervical, supraclavicular, and axillary nodes normal Neurologic:  CNII-XII intact, normal strength, sensation and gait; reflexes 2+ and symmetric throughout Psych: Normal mood, affect, hygiene and grooming. Hemoglobin A1c is 7.4 ASSESSMENT/PLAN: Routine general medical examination at a health care facility - Plan: CBC  with Differential/Platelet, Comprehensive metabolic panel, Lipid panel  Type 2 diabetes mellitus with other circulatory complication, without long-term current use of insulin (HCC) - Plan: POCT glycosylated hemoglobin (Hb A1C)  Hypertension associated with diabetes (HCC) - Plan: amLODipine (NORVASC) 5 MG tablet, lisinopril-hydrochlorothiazide (ZESTORETIC) 20-12.5 MG tablet, CBC with Differential/Platelet, Comprehensive metabolic panel  Aortic valve stenosis, etiology of cardiac valve disease unspecified  Mild persistent asthma without complication - Plan: fluticasone-salmeterol (WIXELA INHUB) 500-50 MCG/ACT AEPB  Seasonal allergic rhinitis, unspecified trigger  Hyperlipidemia associated with type 2 diabetes mellitus (HCC) - Plan: atorvastatin (LIPITOR) 10 MG tablet, Lipid panel  Dependent edema  Morbid obesity (HCC)  OAB (overactive bladder) - Plan: oxybutynin (DITROPAN XL) 5 MG 24 hr tablet  Type 2 diabetes mellitus without complication, without long-term current use of insulin (HCC) - Plan: metFORMIN (GLUCOPHAGE) 850 MG tablet, pioglitazone (ACTOS) 15 MG tablet, CBC with Differential/Platelet, Comprehensive metabolic panel, Lipid panel, POCT UA - Microalbumin  Atherosclerosis of aorta (HCC), Chronic - Plan: Lipid panel  Vaccine counseling    Discussed  at least 30 minutes of aerobic activity at least 5 days/week and weight-bearing exercise 2x/week; ; healthy diet, including goals of calcium and vitamin D intake   Immunization recommendations discussed.  Recommend RSV, flu and COVID shot.  Colonoscopy recommendations reviewed  Medicare Attestation I have personally reviewed: The patient's medical and social history Their use of alcohol, tobacco or illicit drugs Their current medications and supplements The patient's functional ability including ADLs,fall risks, home safety risks, cognitive, and hearing and visual impairment Diet and physical activities Evidence for depression  or mood disorders  The patient's weight, height, and BMI have been recorded in the chart.  I have made referrals, counseling, and provided education to the patient based on review of the above and I have provided the patient with a written personalized care plan for preventive services.   Recheck 6 months  Sharlot Gowda, MD   07/24/2022

## 2022-07-25 DIAGNOSIS — N1832 Chronic kidney disease, stage 3b: Secondary | ICD-10-CM | POA: Insufficient documentation

## 2022-07-28 ENCOUNTER — Telehealth: Payer: Self-pay | Admitting: Family Medicine

## 2022-07-28 NOTE — Telephone Encounter (Signed)
Medical Records was faxed to Medicare for patients diabetic supplies. Faxed to [phone].

## 2022-10-14 ENCOUNTER — Other Ambulatory Visit (INDEPENDENT_AMBULATORY_CARE_PROVIDER_SITE_OTHER): Payer: Medicare HMO

## 2022-10-14 DIAGNOSIS — Z23 Encounter for immunization: Secondary | ICD-10-CM | POA: Diagnosis not present

## 2022-10-20 DIAGNOSIS — E1159 Type 2 diabetes mellitus with other circulatory complications: Secondary | ICD-10-CM | POA: Diagnosis not present

## 2022-11-02 DIAGNOSIS — M436 Torticollis: Secondary | ICD-10-CM | POA: Diagnosis not present

## 2022-11-02 DIAGNOSIS — M62838 Other muscle spasm: Secondary | ICD-10-CM | POA: Diagnosis not present

## 2022-11-03 ENCOUNTER — Telehealth: Payer: Self-pay | Admitting: Family Medicine

## 2022-11-03 NOTE — Telephone Encounter (Signed)
[facility] called but called ID says [employer] t# [phone] wanting to know about the fax that was sent for a back brace for pt.  I called # back & they are a call center for different pharmacy's.  I called pt and left her a message to see if she even requested a back brace. Linda Garcia said she had forwarded these faxs to you)

## 2022-12-29 ENCOUNTER — Other Ambulatory Visit: Payer: Self-pay | Admitting: Family Medicine

## 2022-12-29 DIAGNOSIS — E1159 Type 2 diabetes mellitus with other circulatory complications: Secondary | ICD-10-CM

## 2023-01-19 DIAGNOSIS — E1159 Type 2 diabetes mellitus with other circulatory complications: Secondary | ICD-10-CM | POA: Diagnosis not present

## 2023-02-10 ENCOUNTER — Ambulatory Visit: Payer: Medicare HMO | Admitting: Family Medicine

## 2023-02-10 DIAGNOSIS — I152 Hypertension secondary to endocrine disorders: Secondary | ICD-10-CM

## 2023-02-10 DIAGNOSIS — I350 Nonrheumatic aortic (valve) stenosis: Secondary | ICD-10-CM

## 2023-02-10 DIAGNOSIS — E118 Type 2 diabetes mellitus with unspecified complications: Secondary | ICD-10-CM

## 2023-02-10 DIAGNOSIS — R609 Edema, unspecified: Secondary | ICD-10-CM

## 2023-02-10 DIAGNOSIS — N1832 Chronic kidney disease, stage 3b: Secondary | ICD-10-CM

## 2023-02-10 DIAGNOSIS — J4520 Mild intermittent asthma, uncomplicated: Secondary | ICD-10-CM

## 2023-02-10 DIAGNOSIS — J301 Allergic rhinitis due to pollen: Secondary | ICD-10-CM

## 2023-02-10 DIAGNOSIS — E1169 Type 2 diabetes mellitus with other specified complication: Secondary | ICD-10-CM

## 2023-02-10 DIAGNOSIS — I700 Atherosclerosis of aorta: Secondary | ICD-10-CM

## 2023-02-11 ENCOUNTER — Other Ambulatory Visit: Payer: Self-pay | Admitting: Family Medicine

## 2023-02-11 DIAGNOSIS — E119 Type 2 diabetes mellitus without complications: Secondary | ICD-10-CM

## 2023-02-18 ENCOUNTER — Encounter: Payer: Self-pay | Admitting: Family Medicine

## 2023-02-18 ENCOUNTER — Ambulatory Visit (INDEPENDENT_AMBULATORY_CARE_PROVIDER_SITE_OTHER): Payer: Medicare HMO | Admitting: Family Medicine

## 2023-02-18 VITALS — BP 136/76 | HR 88 | Ht 64.0 in | Wt 173.0 lb

## 2023-02-18 DIAGNOSIS — I152 Hypertension secondary to endocrine disorders: Secondary | ICD-10-CM | POA: Diagnosis not present

## 2023-02-18 DIAGNOSIS — E1169 Type 2 diabetes mellitus with other specified complication: Secondary | ICD-10-CM

## 2023-02-18 DIAGNOSIS — N1832 Chronic kidney disease, stage 3b: Secondary | ICD-10-CM | POA: Diagnosis not present

## 2023-02-18 DIAGNOSIS — N3281 Overactive bladder: Secondary | ICD-10-CM | POA: Diagnosis not present

## 2023-02-18 DIAGNOSIS — E785 Hyperlipidemia, unspecified: Secondary | ICD-10-CM | POA: Diagnosis not present

## 2023-02-18 DIAGNOSIS — E118 Type 2 diabetes mellitus with unspecified complications: Secondary | ICD-10-CM

## 2023-02-18 DIAGNOSIS — J4530 Mild persistent asthma, uncomplicated: Secondary | ICD-10-CM

## 2023-02-18 DIAGNOSIS — E119 Type 2 diabetes mellitus without complications: Secondary | ICD-10-CM

## 2023-02-18 DIAGNOSIS — E1159 Type 2 diabetes mellitus with other circulatory complications: Secondary | ICD-10-CM | POA: Diagnosis not present

## 2023-02-18 LAB — POCT GLYCOSYLATED HEMOGLOBIN (HGB A1C): Hemoglobin A1C: 7.3 % — AB (ref 4.0–5.6)

## 2023-02-18 MED ORDER — METFORMIN HCL 850 MG PO TABS: 850.0000 mg | ORAL_TABLET | Freq: Two times a day (BID) | ORAL | 1 refills | Status: DC

## 2023-02-18 MED ORDER — AMLODIPINE BESYLATE 5 MG PO TABS: 5.0000 mg | ORAL_TABLET | Freq: Every day | ORAL | 3 refills | Status: DC

## 2023-02-18 MED ORDER — LISINOPRIL-HYDROCHLOROTHIAZIDE 20-12.5 MG PO TABS: 1.0000 | ORAL_TABLET | Freq: Every day | ORAL | 3 refills | Status: AC

## 2023-02-18 MED ORDER — OXYBUTYNIN CHLORIDE ER 5 MG PO TB24: 5.0000 mg | ORAL_TABLET | Freq: Every day | ORAL | 1 refills | Status: DC

## 2023-02-18 NOTE — Progress Notes (Signed)
 Subjective:    Patient ID: Linda Garcia, female    DOB: 11-23-1934, 89 y.o.   MRN: [id number]  Linda Garcia is a 89 y.o. female who presents for follow-up of Type 2 diabetes mellitus.  Patient is checking home blood sugars.   Home blood sugar records: BGs range between 110 and 176 How often is blood sugars being checked: Daily, fasting Current symptoms/problems include  paresthesia of right fingers  and have been stable. Daily foot checks: Yes   Any foot concerns: None Last eye exam: April 2024 Exercise: Home exercise routine includes leg stretches. She states that the Detrol pan did help with her bladder function but also caused dry mouth and she did not like taking it.  She continues on lisinopril  and amlodipine .  She uses her inhaler and is getting good results with that.  Also continues on Lipitor.  She takes Actos  and metformin  for her diabetes.  Does have evidence of stage III CKD. The following portions of the patient's history were reviewed and updated as appropriate: allergies, current medications, past medical history, past social history and problem list.  ROS as in subjective above.     Objective:    Physical Exam Alert and in no distress otherwise not examined. Hemoglobin A1c is 7.3 Lab Review    Latest Ref Rng & Units 02/18/2023    2:30 PM 07/24/2022    1:10 PM 07/24/2022   11:03 AM 12/31/2021    3:07 PM 12/31/2021   11:28 AM  Diabetic Labs  HbA1c 4.0 - 5.6 % 7.3  7.4   7.2    Chol 100 - 199 mg/dL   409   811   HDL >91 mg/dL   58   58   Calc LDL 0 - 99 mg/dL   87   94   Triglycerides 0 - 149 mg/dL   51   63   Creatinine 0.57 - 1.00 mg/dL   4.78   2.95       07/24/3084    2:17 PM 07/24/2022   10:27 AM 07/24/2022   10:19 AM 12/31/2021   10:58 AM 10/31/2021    8:11 AM  BP/Weight  Systolic BP 136 140 148 124 --  Diastolic BP 76 74 70 70 --  Wt. (Lbs) 173  174.8 181 173  BMI 29.7 kg/m2  30.96 kg/m2 32.06 kg/m2 29.7 kg/m2      12/31/2021   11:00  AM 03/04/2021   11:45 AM  Foot/eye exam completion dates  Foot Form Completion Done Done    Linda Garcia  reports that she has never smoked. She has never used smokeless tobacco. She reports that she does not drink alcohol and does not use drugs.     Assessment & Plan:    Controlled type 2 diabetes mellitus with complication, without long-term current use of insulin (HCC) - Plan: POCT glycosylated hemoglobin (Hb A1C)  Morbid obesity (HCC)  Stage 3b chronic kidney disease (HCC)  Hyperlipidemia associated with type 2 diabetes mellitus (HCC)  Hypertension associated with diabetes (HCC) - Plan: amLODipine  (NORVASC ) 5 MG tablet, lisinopril -hydrochlorothiazide  (ZESTORETIC ) 20-12.5 MG tablet  Type 2 diabetes mellitus without complication, without long-term current use of insulin (HCC) - Plan: metFORMIN  (GLUCOPHAGE ) 850 MG tablet  Mild persistent asthma without complication  OAB (overactive bladder) - Plan: oxybutynin  (DITROPAN  XL) 5 MG 24 hr tablet  I will have her cut the Ditropan  in half and see if it will help with her bladder symptoms but also cut down on  dryness.  She will keep me informed concerning this.  Otherwise she will continue on her other medications and recheck here in approximately 6 months.

## 2023-03-27 ENCOUNTER — Telehealth: Payer: Self-pay

## 2023-03-27 NOTE — Telephone Encounter (Signed)
Copied from CRM [id number]. Topic: General - Other >> Mar 27, 2023  9:18 AM Carlatta H wrote: Reason for CRM: Pharmacy needs paperwork for 1 knee brace and 1 bed brace//Please refax back at fax [phone]

## 2023-04-20 DIAGNOSIS — E1159 Type 2 diabetes mellitus with other circulatory complications: Secondary | ICD-10-CM | POA: Diagnosis not present

## 2023-04-22 ENCOUNTER — Telehealth: Payer: Self-pay

## 2023-04-22 NOTE — Telephone Encounter (Signed)
 Please note,     Our office has received a form via fax to complete to verify that the patient is still receiving continued care from Dr. Susann Givens. The form completed and faxed back . Nothing further needed

## 2023-06-02 DIAGNOSIS — H5203 Hypermetropia, bilateral: Secondary | ICD-10-CM | POA: Diagnosis not present

## 2023-07-15 ENCOUNTER — Other Ambulatory Visit: Payer: Self-pay | Admitting: Family Medicine

## 2023-07-15 DIAGNOSIS — J4530 Mild persistent asthma, uncomplicated: Secondary | ICD-10-CM

## 2023-07-21 DIAGNOSIS — E119 Type 2 diabetes mellitus without complications: Secondary | ICD-10-CM | POA: Diagnosis not present

## 2023-08-05 ENCOUNTER — Other Ambulatory Visit: Payer: Self-pay | Admitting: Family Medicine

## 2023-08-05 DIAGNOSIS — N3281 Overactive bladder: Secondary | ICD-10-CM

## 2023-08-05 DIAGNOSIS — E119 Type 2 diabetes mellitus without complications: Secondary | ICD-10-CM

## 2023-08-06 ENCOUNTER — Ambulatory Visit: Payer: Medicare HMO | Admitting: Family Medicine

## 2023-08-06 ENCOUNTER — Encounter: Payer: Self-pay | Admitting: Family Medicine

## 2023-08-06 VITALS — BP 120/70 | HR 79 | Resp 97 | Ht 63.0 in | Wt 168.2 lb

## 2023-08-06 DIAGNOSIS — E785 Hyperlipidemia, unspecified: Secondary | ICD-10-CM

## 2023-08-06 DIAGNOSIS — E1169 Type 2 diabetes mellitus with other specified complication: Secondary | ICD-10-CM | POA: Diagnosis not present

## 2023-08-06 DIAGNOSIS — I350 Nonrheumatic aortic (valve) stenosis: Secondary | ICD-10-CM | POA: Diagnosis not present

## 2023-08-06 DIAGNOSIS — Z23 Encounter for immunization: Secondary | ICD-10-CM

## 2023-08-06 DIAGNOSIS — I152 Hypertension secondary to endocrine disorders: Secondary | ICD-10-CM

## 2023-08-06 DIAGNOSIS — E118 Type 2 diabetes mellitus with unspecified complications: Secondary | ICD-10-CM

## 2023-08-06 DIAGNOSIS — Z9849 Cataract extraction status, unspecified eye: Secondary | ICD-10-CM

## 2023-08-06 DIAGNOSIS — J301 Allergic rhinitis due to pollen: Secondary | ICD-10-CM

## 2023-08-06 DIAGNOSIS — E1159 Type 2 diabetes mellitus with other circulatory complications: Secondary | ICD-10-CM | POA: Diagnosis not present

## 2023-08-06 DIAGNOSIS — Z9841 Cataract extraction status, right eye: Secondary | ICD-10-CM

## 2023-08-06 DIAGNOSIS — N3281 Overactive bladder: Secondary | ICD-10-CM

## 2023-08-06 DIAGNOSIS — Z0000 Encounter for general adult medical examination without abnormal findings: Secondary | ICD-10-CM

## 2023-08-06 DIAGNOSIS — J4530 Mild persistent asthma, uncomplicated: Secondary | ICD-10-CM

## 2023-08-06 DIAGNOSIS — J4520 Mild intermittent asthma, uncomplicated: Secondary | ICD-10-CM

## 2023-08-06 DIAGNOSIS — N1832 Chronic kidney disease, stage 3b: Secondary | ICD-10-CM

## 2023-08-06 LAB — POCT GLYCOSYLATED HEMOGLOBIN (HGB A1C): Hemoglobin A1C: 6.7 % — AB (ref 4.0–5.6)

## 2023-08-06 LAB — LIPID PANEL

## 2023-08-06 MED ORDER — METFORMIN HCL 850 MG PO TABS: 850.0000 mg | ORAL_TABLET | Freq: Two times a day (BID) | ORAL | 1 refills | Status: AC

## 2023-08-06 MED ORDER — PIOGLITAZONE HCL 15 MG PO TABS: 15.0000 mg | ORAL_TABLET | Freq: Every day | ORAL | 3 refills | Status: AC

## 2023-08-06 MED ORDER — ATORVASTATIN CALCIUM 10 MG PO TABS
10.0000 mg | ORAL_TABLET | Freq: Every day | ORAL | 3 refills | Status: DC
Start: 2023-08-06 — End: 2023-08-07

## 2023-08-06 MED ORDER — FLUTICASONE-SALMETEROL 500-50 MCG/ACT IN AEPB: 1.0000 | INHALATION_SPRAY | Freq: Two times a day (BID) | RESPIRATORY_TRACT | 3 refills | Status: AC

## 2023-08-06 MED ORDER — OXYBUTYNIN CHLORIDE ER 5 MG PO TB24: 5.0000 mg | ORAL_TABLET | Freq: Every day | ORAL | 3 refills | Status: AC

## 2023-08-06 NOTE — Progress Notes (Signed)
 Linda Garcia is a 89 y.o. female who presents for annual wellness visit and follow-up on chronic medical conditions.  She has no particular concerns or complaints.  She continues on her metformin  and pioglitazone  and having no difficulty with that.  Her allergies and asthma are under good control on her present medication regimen.  She uses the rescue inhaler usually less than twice per week.  She is also taking amlodipine  and lisinopril  without difficulty.  Ditropan  is controlling her bladder issues.  She has had her cataracts removed.  Immunizations and Health Maintenance Immunization History  Administered Date(s) Administered   Fluad Quad(high Dose 65+) 10/29/2018, 11/09/2019, 10/30/2020, 12/17/2021   Fluad Trivalent(High Dose 65+) 10/14/2022   Influenza Split 11/01/2010, 10/27/2011   Influenza Whole 11/14/2009   Influenza, High Dose Seasonal PF 11/16/2013, 10/02/2014, 11/01/2015, 12/02/2016   Influenza,inj,Quad PF,6+ Mos 10/27/2012   Influenza-Unspecified 10/04/2017   Moderna Covid-19 Vaccine Bivalent Booster 34yrs & up 10/30/2020   PFIZER Comirnaty(Gray Top)Covid-19 Tri-Sucrose Vaccine 06/27/2020   PFIZER(Purple Top)SARS-COV-2 Vaccination 02/24/2019, 03/17/2019, 11/09/2019   Pfizer(Comirnaty)Fall Seasonal Vaccine 12 years and older 12/17/2021, 10/14/2022   Pneumococcal Conjugate-13 07/16/2016   Pneumococcal Polysaccharide-23 02/04/2008, 07/15/2013   Td 06/11/2005   Tdap 11/09/2015   Zoster Recombinant(Shingrix) 10/01/2017, 12/02/2017   Zoster, Live 08/03/2012   Health Maintenance Due  Topic Date Due   OPHTHALMOLOGY EXAM  12/26/2021   FOOT EXAM  01/01/2023   COVID-19 Vaccine (8 - 2024-25 season) 04/13/2023   Medicare Annual Wellness (AWV)  07/24/2023    Last Pap smear: N/A Last mammogram: 08/30/2021 Last colonoscopy: 02/17/2013 Last DEXA: 11/26/2007 Dentist: within the last year Ophtho: within the last year  Exercise: no exercise routine  Other doctors caring for  patient include: [facility]  Advanced directives: Does Patient Have a Medical Advance Directive?: No Would patient like information on creating a medical advance directive?: No - Patient declined  Depression screen:  See questionnaire below.     08/06/2023   10:42 AM 07/24/2022   10:31 AM 10/31/2021    8:19 AM 10/30/2020   11:32 AM 12/27/2019   11:16 AM  Depression screen PHQ 2/9  Decreased Interest 0 0 0 0 0  Down, Depressed, Hopeless 0 0 0 0 0  PHQ - 2 Score 0 0 0 0 0  Altered sleeping   0    Tired, decreased energy   0    Change in appetite   0    Feeling bad or failure about yourself    0    Trouble concentrating   0    Moving slowly or fidgety/restless   0    Suicidal thoughts   0    PHQ-9 Score   0    Difficult doing work/chores   Not difficult at all      Fall Risk Screen: see questionnaire below.    08/06/2023   10:42 AM 07/24/2022   10:31 AM 07/24/2022   10:18 AM 10/31/2021    8:18 AM 10/30/2020   11:31 AM  Fall Risk   Falls in the past year? 0 0 0 1 0  Comment    tripped over the curb   Number falls in past yr: 0 0 0 0 0  Injury with Fall? 0 0 0 0 0  Risk for fall due to : No Fall Risks No Fall Risks No Fall Risks Medication side effect No Fall Risks  Follow up Falls evaluation completed Falls evaluation completed Falls evaluation completed Falls  prevention discussed  Falls evaluation completed      Data saved with a previous flowsheet row definition    ADL screen:  See questionnaire below Functional Status Survey: Is the patient deaf or have difficulty hearing?: No Does the patient have difficulty seeing, even when wearing glasses/contacts?: No Does the patient have difficulty concentrating, remembering, or making decisions?: No Does the patient have difficulty walking or climbing stairs?: No Does the patient have difficulty dressing or bathing?: No Does the patient have difficulty doing errands alone such as  visiting a doctor's office or shopping?: No   Review of Systems Constitutional: -, -unexpected weight change, -anorexia, -fatigue Allergy: -sneezing, -itching, -congestion Dermatology: denies changing moles, rash, lumps ENT: -runny nose, -ear pain, -sore throat,  Cardiology:  -chest pain, -palpitations, -orthopnea, Respiratory: -cough, -shortness of breath, -dyspnea on exertion, -wheezing,  Gastroenterology: -abdominal pain, -nausea, -vomiting, -diarrhea, -constipation, -dysphagia Hematology: -bleeding or bruising problems Musculoskeletal: -arthralgias, -myalgias, -joint swelling, -back pain, - Ophthalmology: -vision changes,  Urology: -dysuria, -difficulty urinating,  -urinary frequency, -urgency, incontinence Neurology: -, -numbness, , -memory loss, -falls, -dizziness    PHYSICAL EXAM:  General Appearance: Alert, cooperative, no distress, appears stated age Head: Normocephalic, without obvious abnormality, atraumatic Eyes: PERRL, conjunctiva/corneas clear, EOM's intact,  Ears: Normal TM's and external ear canals Nose: Nares normal, mucosa normal, no drainage or sinus tenderness Throat: Lips, mucosa, and tongue normal; teeth and gums normal Neck: Supple, no lymphadenopathy;  thyroid:  no enlargement/tenderness/nodules; no carotid bruit or JVD Lungs: Clear to auscultation bilaterally without wheezes, rales or ronchi; respirations unlabored Heart: Regular rate and rhythm, S1 and S2 normal,1/6 SEM Extremities: No clubbing, cyanosis or edema.  Foot exam is normal. Pulses: 2+ and symmetric all extremities Skin:  Skin color, texture, turgor normal, no rashes or lesions Lymph nodes: Cervical, supraclavicular, and axillary nodes normal Neurologic:  CNII-XII intact, normal strength, sensation and gait; reflexes 2+ and symmetric throughout Psych: Normal mood, affect, hygiene and grooming. Hemoglobin A1c is 6.7 ASSESSMENT/PLAN: Routine general medical examination at a health care  facility  Stage 3b chronic kidney disease (HCC) - Plan: CBC with Differential/Platelet, Comprehensive metabolic panel with GFR  OAB (overactive bladder) - Plan: oxybutynin  (DITROPAN  XL) 5 MG 24 hr tablet  Morbid obesity (HCC) - Plan: CBC with Differential/Platelet, Comprehensive metabolic panel with GFR, Lipid panel  Hypertension associated with diabetes (HCC)  Hyperlipidemia associated with type 2 diabetes mellitus (HCC) - Plan: Lipid panel, atorvastatin  (LIPITOR) 10 MG tablet  History of cataract extraction, unspecified laterality  Controlled type 2 diabetes mellitus with complication, without long-term current use of insulin (HCC) - Plan: CBC with Differential/Platelet, Comprehensive metabolic panel with GFR, Lipid panel, POCT glycosylated hemoglobin (Hb A1C), POCT UA - Microalbumin, metFORMIN  (GLUCOPHAGE ) 850 MG tablet, pioglitazone  (ACTOS ) 15 MG tablet  Nonrheumatic aortic valve stenosis  Seasonal allergic rhinitis due to pollen  Mild intermittent asthma without complication  Mild persistent asthma without complication - Plan: fluticasone -salmeterol (WIXELA INHUB) 500-50 MCG/ACT AEPB  Need for vaccination against Streptococcus pneumoniae - Plan: Pneumococcal conjugate vaccine 20-valent (Prevnar 20)  History of right cataract extraction  Immunization recommendations discussed.  Colonoscopy recommendations reviewed recheck here in 6 months.   Medicare Attestation I have personally reviewed: The patient's medical and social history Their use of alcohol, tobacco or illicit drugs Their current medications and supplements The patient's functional ability including ADLs,fall risks, home safety risks, cognitive, and hearing and visual impairment Diet and physical activities Evidence for depression or mood disorders  The patient's weight, height, and BMI  have been recorded in the chart.  I have made referrals, counseling, and provided education to the patient based on review of  the above and I have provided the patient with a written personalized care plan for preventive services.     Norleen Jobs, MD   08/06/2023

## 2023-08-07 ENCOUNTER — Ambulatory Visit: Payer: Self-pay | Admitting: Family Medicine

## 2023-08-07 LAB — CBC WITH DIFFERENTIAL/PLATELET
Basophils Absolute: 0 x10E3/uL (ref 0.0–0.2)
Basos: 0 %
EOS (ABSOLUTE): 0.1 x10E3/uL (ref 0.0–0.4)
Eos: 3 %
Hematocrit: 37.7 % (ref 34.0–46.6)
Hemoglobin: 11.5 g/dL (ref 11.1–15.9)
Immature Grans (Abs): 0 x10E3/uL (ref 0.0–0.1)
Immature Granulocytes: 0 %
Lymphocytes Absolute: 0.9 x10E3/uL (ref 0.7–3.1)
Lymphs: 21 %
MCH: 27.6 pg (ref 26.6–33.0)
MCHC: 30.5 g/dL — ABNORMAL LOW (ref 31.5–35.7)
MCV: 90 fL (ref 79–97)
Monocytes Absolute: 0.4 x10E3/uL (ref 0.1–0.9)
Monocytes: 9 %
Neutrophils Absolute: 2.9 x10E3/uL (ref 1.4–7.0)
Neutrophils: 67 %
Platelets: 174 x10E3/uL (ref 150–450)
RBC: 4.17 x10E6/uL (ref 3.77–5.28)
RDW: 14.6 % (ref 11.7–15.4)
WBC: 4.4 x10E3/uL (ref 3.4–10.8)

## 2023-08-07 LAB — COMPREHENSIVE METABOLIC PANEL WITH GFR
ALT: 18 IU/L (ref 0–32)
AST: 25 IU/L (ref 0–40)
Albumin: 4.3 g/dL (ref 3.7–4.7)
Alkaline Phosphatase: 104 IU/L (ref 44–121)
BUN/Creatinine Ratio: 22 (ref 12–28)
BUN: 28 mg/dL — ABNORMAL HIGH (ref 8–27)
Bilirubin Total: 0.3 mg/dL (ref 0.0–1.2)
CO2: 20 mmol/L (ref 20–29)
Calcium: 9.4 mg/dL (ref 8.7–10.3)
Chloride: 100 mmol/L (ref 96–106)
Creatinine, Ser: 1.28 mg/dL — ABNORMAL HIGH (ref 0.57–1.00)
Globulin, Total: 2.7 g/dL (ref 1.5–4.5)
Glucose: 124 mg/dL — ABNORMAL HIGH (ref 70–99)
Potassium: 4 mmol/L (ref 3.5–5.2)
Sodium: 138 mmol/L (ref 134–144)
Total Protein: 7 g/dL (ref 6.0–8.5)
eGFR: 41 mL/min/1.73 — ABNORMAL LOW (ref >59)

## 2023-08-07 LAB — LIPID PANEL
Chol/HDL Ratio: 3.1 ratio (ref 0.0–4.4)
Cholesterol, Total: 163 mg/dL (ref 100–199)
HDL: 53 mg/dL (ref >39)
LDL Chol Calc (NIH): 98 mg/dL (ref 0–99)
Triglycerides: 61 mg/dL (ref 0–149)
VLDL Cholesterol Cal: 12 mg/dL (ref 5–40)

## 2023-08-07 MED ORDER — ATORVASTATIN CALCIUM 20 MG PO TABS: 20.0000 mg | ORAL_TABLET | Freq: Every day | ORAL | 3 refills | Status: AC

## 2023-08-07 NOTE — Addendum Note (Signed)
 Addended by: JOYCE NORLEEN BROCKS on: 08/07/2023 08:08 AM   Modules accepted: Orders

## 2023-10-07 DIAGNOSIS — Z79899 Other long term (current) drug therapy: Secondary | ICD-10-CM | POA: Diagnosis not present

## 2023-11-10 ENCOUNTER — Telehealth: Payer: Self-pay

## 2023-11-10 NOTE — Telephone Encounter (Signed)
 Copied from CRM [id number]. Topic: Clinical - Request for Lab/Test Order >> Nov 10, 2023  2:41 PM [name] wrote: Reason for CRM: patient request a script for Pfizer covid vaccine to be sent to [id number] - [place], [place] - [address] AT [facility] OF [address]  Phone: [phone] Fax: [phone]   Patient scheduled to come in office for flu shot

## 2023-11-11 ENCOUNTER — Ambulatory Visit (INDEPENDENT_AMBULATORY_CARE_PROVIDER_SITE_OTHER)

## 2023-11-11 DIAGNOSIS — Z23 Encounter for immunization: Secondary | ICD-10-CM | POA: Diagnosis not present

## 2024-01-29 ENCOUNTER — Other Ambulatory Visit: Payer: Self-pay

## 2024-02-09 ENCOUNTER — Ambulatory Visit (INDEPENDENT_AMBULATORY_CARE_PROVIDER_SITE_OTHER): Payer: Self-pay | Admitting: Family Medicine

## 2024-02-09 VITALS — BP 126/80 | HR 92 | Wt 168.0 lb

## 2024-02-09 DIAGNOSIS — E119 Type 2 diabetes mellitus without complications: Secondary | ICD-10-CM

## 2024-02-09 LAB — POCT GLYCOSYLATED HEMOGLOBIN (HGB A1C): Hemoglobin A1C: 6.6 % — AB (ref 4.0–5.6)

## 2024-02-09 NOTE — Progress Notes (Signed)
" ° °  Name: Linda Garcia   Date of Visit: 02/09/2024   Date of last visit with me: Visit date not found   CHIEF COMPLAINT:  Chief Complaint  Patient presents with   Follow-up    6 month follow up. Having some nose bleeds.        HPI:  Discussed the use of AI scribe software for clinical note transcription with the patient, who gave verbal consent to proceed.  History of Present Illness   Linda Garcia is an 89 year old female who presents with recurrent epistaxis.  She has experienced two episodes of epistaxis within the past month. Today, she feels unusually sleepy, which is not typical for her. There are no associated symptoms such as pain or dizziness.  She does not use a humidifier in her room, which may contribute to nasal dryness, especially during the winter months.  She has a history of diabetes with a current A1c of 6.6.  No other symptoms or concerns beyond the epistaxis are reported.         OBJECTIVE:       08/06/2023   10:42 AM  Depression screen PHQ 2/9  Decreased Interest 0  Down, Depressed, Hopeless 0  PHQ - 2 Score 0     BP Readings from Last 3 Encounters:  02/09/24 126/80  08/06/23 120/70  02/18/23 136/76    BP 126/80   Pulse 92   Wt 168 lb (76.2 kg)   SpO2 96%   BMI 29.76 kg/m    Physical Exam          Physical Exam Constitutional:      Appearance: Normal appearance.  Neurological:     General: No focal deficit present.     Mental Status: She is alert and oriented to person, place, and time. Mental status is at baseline.     ASSESSMENT/PLAN:   Assessment & Plan Type 2 diabetes mellitus without complication, without long-term current use of insulin (HCC)    Assessment and Plan    Epistaxis Recurrent epistaxis likely due to nasal dryness. - Purchase a humidifier for the bedroom and use it at night. - Apply a small amount of Vaseline with a Q-tip to the nasal area before bed.  Type 2 diabetes  mellitus Well-controlled with an A1c of 6.6%. - Continue current diabetes management plan.         Debara Kamphuis A. Vita MD [facility] "

## 2024-03-10 ENCOUNTER — Other Ambulatory Visit: Payer: Self-pay | Admitting: Family Medicine

## 2024-03-10 DIAGNOSIS — E1159 Type 2 diabetes mellitus with other circulatory complications: Secondary | ICD-10-CM

## 2024-08-11 ENCOUNTER — Ambulatory Visit: Payer: Self-pay | Admitting: Family Medicine
